# Patient Record
Sex: Male | Born: 1970
Health system: Southern US, Community
[De-identification: ages and names within clinical notes are randomized; demographics above are authoritative.]

## PROBLEM LIST (undated history)

## (undated) DIAGNOSIS — R739 Hyperglycemia, unspecified: Secondary | ICD-10-CM

## (undated) DIAGNOSIS — I2699 Other pulmonary embolism without acute cor pulmonale: Secondary | ICD-10-CM

## (undated) DIAGNOSIS — D573 Sickle-cell trait: Secondary | ICD-10-CM

## (undated) DIAGNOSIS — J189 Pneumonia, unspecified organism: Secondary | ICD-10-CM

## (undated) HISTORY — DX: Sickle-cell trait: D57.3

## (undated) HISTORY — DX: Other pulmonary embolism without acute cor pulmonale: I26.99

## (undated) HISTORY — DX: Pneumonia, unspecified organism: J18.9

## (undated) HISTORY — DX: Hyperglycemia, unspecified: R73.9

---

## 2008-03-06 DIAGNOSIS — J189 Pneumonia, unspecified organism: Secondary | ICD-10-CM

## 2008-03-06 HISTORY — DX: Pneumonia, unspecified organism: J18.9

## 2008-09-03 ENCOUNTER — Emergency Department (HOSPITAL_BASED_OUTPATIENT_CLINIC_OR_DEPARTMENT_OTHER): Admission: EM | Admit: 2008-09-03 | Discharge: 2008-09-03 | Payer: Self-pay | Admitting: Emergency Medicine

## 2008-09-03 ENCOUNTER — Ambulatory Visit: Payer: Self-pay | Admitting: Interventional Radiology

## 2008-11-16 ENCOUNTER — Ambulatory Visit: Payer: Self-pay | Admitting: Diagnostic Radiology

## 2008-11-16 ENCOUNTER — Emergency Department (HOSPITAL_BASED_OUTPATIENT_CLINIC_OR_DEPARTMENT_OTHER): Admission: EM | Admit: 2008-11-16 | Discharge: 2008-11-16 | Payer: Self-pay | Admitting: Emergency Medicine

## 2008-11-16 ENCOUNTER — Inpatient Hospital Stay (HOSPITAL_COMMUNITY): Admission: EM | Admit: 2008-11-16 | Discharge: 2008-11-19 | Payer: Self-pay | Admitting: Emergency Medicine

## 2008-11-17 ENCOUNTER — Ambulatory Visit: Payer: Self-pay | Admitting: Vascular Surgery

## 2008-11-17 ENCOUNTER — Encounter (INDEPENDENT_AMBULATORY_CARE_PROVIDER_SITE_OTHER): Payer: Self-pay | Admitting: Internal Medicine

## 2008-11-24 DIAGNOSIS — J13 Pneumonia due to Streptococcus pneumoniae: Secondary | ICD-10-CM | POA: Insufficient documentation

## 2008-11-25 ENCOUNTER — Ambulatory Visit: Payer: Self-pay | Admitting: Internal Medicine

## 2008-11-25 DIAGNOSIS — J90 Pleural effusion, not elsewhere classified: Secondary | ICD-10-CM | POA: Insufficient documentation

## 2008-12-04 ENCOUNTER — Ambulatory Visit: Payer: Self-pay | Admitting: Internal Medicine

## 2008-12-14 ENCOUNTER — Ambulatory Visit: Payer: Self-pay | Admitting: Internal Medicine

## 2010-03-30 ENCOUNTER — Emergency Department (HOSPITAL_BASED_OUTPATIENT_CLINIC_OR_DEPARTMENT_OTHER)
Admission: EM | Admit: 2010-03-30 | Discharge: 2010-03-31 | Payer: Self-pay | Source: Home / Self Care | Admitting: Emergency Medicine

## 2010-04-06 HISTORY — PX: FINGER SURGERY: SHX640

## 2010-06-05 DIAGNOSIS — I2699 Other pulmonary embolism without acute cor pulmonale: Secondary | ICD-10-CM

## 2010-06-05 DIAGNOSIS — Z86711 Personal history of pulmonary embolism: Secondary | ICD-10-CM | POA: Insufficient documentation

## 2010-06-05 HISTORY — DX: Other pulmonary embolism without acute cor pulmonale: I26.99

## 2010-06-10 LAB — CULTURE, BLOOD (ROUTINE X 2)

## 2010-06-10 LAB — DIFFERENTIAL
Basophils Absolute: 0 10*3/uL (ref 0.0–0.1)
Basophils Relative: 4 % — ABNORMAL HIGH (ref 0–1)
Eosinophils Absolute: 0.6 10*3/uL (ref 0.0–0.7)
Eosinophils Relative: 4 % (ref 0–5)
Eosinophils Relative: 2 % (ref 0–5)
Lymphocytes Relative: 11 % — ABNORMAL LOW (ref 12–46)
Monocytes Absolute: 1.8 10*3/uL — ABNORMAL HIGH (ref 0.1–1.0)
Monocytes Relative: 13 % — ABNORMAL HIGH (ref 3–12)
Neutrophils Relative %: 76 % (ref 43–77)

## 2010-06-10 LAB — HIV-1 RNA QUANT-NO REFLEX-BLD: HIV 1 RNA Quant: <48 copies/mL (ref <48)

## 2010-06-10 LAB — URINALYSIS, ROUTINE W REFLEX MICROSCOPIC
Bilirubin Urine: NEGATIVE
Glucose, UA: NEGATIVE mg/dL
Hgb urine dipstick: NEGATIVE
Protein, ur: NEGATIVE mg/dL
Specific Gravity, Urine: 1.016 (ref 1.005–1.030)

## 2010-06-10 LAB — COMPREHENSIVE METABOLIC PANEL
Albumin: 3.4 g/dL — ABNORMAL LOW (ref 3.5–5.2)
BUN: 9 mg/dL (ref 6–23)
Creatinine, Ser: 1.17 mg/dL (ref 0.4–1.5)
Potassium: 4 mEq/L (ref 3.5–5.1)
Total Protein: 6.9 g/dL (ref 6.0–8.3)

## 2010-06-10 LAB — CBC
HCT: 42.6 % (ref 39.0–52.0)
HCT: 38.1 % — ABNORMAL LOW (ref 39.0–52.0)
HCT: 39.5 % (ref 39.0–52.0)
Hemoglobin: 14.6 g/dL (ref 13.0–17.0)
MCHC: 34.2 g/dL (ref 30.0–36.0)
MCV: 78.8 fL (ref 78.0–100.0)
MCV: 79.6 fL (ref 78.0–100.0)
Platelets: 243 10*3/uL (ref 150–400)
Platelets: 272 10*3/uL (ref 150–400)
RBC: 5.41 MIL/uL (ref 4.22–5.81)
RDW: 14.5 % (ref 11.5–15.5)
RDW: 15 % (ref 11.5–15.5)
RDW: 15.3 % (ref 11.5–15.5)

## 2010-06-10 LAB — BASIC METABOLIC PANEL
BUN: 8 mg/dL (ref 6–23)
CO2: 27 mEq/L (ref 19–32)
Calcium: 8.5 mg/dL (ref 8.4–10.5)
Chloride: 103 mEq/L (ref 96–112)
Creatinine, Ser: 1.12 mg/dL (ref 0.4–1.5)
GFR calc Af Amer: >60 mL/min (ref >60)
GFR calc non Af Amer: >60 mL/min (ref >60)
Glucose, Bld: 140 mg/dL — ABNORMAL HIGH (ref 70–99)
Glucose, Bld: 109 mg/dL — ABNORMAL HIGH (ref 70–99)
Potassium: 3.8 mEq/L (ref 3.5–5.1)
Sodium: 140 mEq/L (ref 135–145)

## 2010-06-10 LAB — EXPECTORATED SPUTUM ASSESSMENT W GRAM STAIN, RFLX TO RESP C

## 2010-06-10 LAB — PHOSPHORUS: Phosphorus: 3.8 mg/dL (ref 2.3–4.6)

## 2010-06-10 LAB — APTT: aPTT: 33 seconds (ref 24–37)

## 2010-06-10 LAB — TROPONIN I: Troponin I: 0.02 ng/mL (ref 0.00–0.06)

## 2010-06-10 LAB — CK TOTAL AND CKMB (NOT AT ARMC)
CK, MB: 1 ng/mL (ref 0.3–4.0)
Total CK: 228 U/L (ref 7–232)

## 2010-06-10 LAB — MAGNESIUM: Magnesium: 2.1 mg/dL (ref 1.5–2.5)

## 2010-06-10 LAB — PROTIME-INR: Prothrombin Time: 14.9 seconds (ref 11.6–15.2)

## 2010-06-12 LAB — DIFFERENTIAL
Basophils Absolute: 0.2 10*3/uL — ABNORMAL HIGH (ref 0.0–0.1)
Lymphocytes Relative: 30 % (ref 12–46)
Monocytes Absolute: 1.1 10*3/uL — ABNORMAL HIGH (ref 0.1–1.0)
Monocytes Relative: 11 % (ref 3–12)
Neutro Abs: 5.5 10*3/uL (ref 1.7–7.7)
Neutrophils Relative %: 55 % (ref 43–77)

## 2010-06-12 LAB — CBC
Hemoglobin: 15 g/dL (ref 13.0–17.0)
RBC: 5.67 MIL/uL (ref 4.22–5.81)

## 2010-06-12 LAB — BASIC METABOLIC PANEL
Calcium: 8.9 mg/dL (ref 8.4–10.5)
Creatinine, Ser: 1.1 mg/dL (ref 0.4–1.5)
GFR calc Af Amer: >60 mL/min (ref >60)
GFR calc non Af Amer: >60 mL/min (ref >60)
Sodium: 142 mEq/L (ref 135–145)

## 2010-06-12 LAB — POCT CARDIAC MARKERS: Troponin i, poc: <0.05 ng/mL (ref 0.00–0.09)

## 2010-07-01 ENCOUNTER — Emergency Department (INDEPENDENT_AMBULATORY_CARE_PROVIDER_SITE_OTHER): Payer: 59

## 2010-07-01 ENCOUNTER — Emergency Department (HOSPITAL_BASED_OUTPATIENT_CLINIC_OR_DEPARTMENT_OTHER)
Admission: EM | Admit: 2010-07-01 | Discharge: 2010-07-02 | Disposition: A | Discharge status: Nursing Home (Includes ICF) | Payer: 59 | Source: Home / Self Care | Attending: Emergency Medicine | Admitting: Emergency Medicine

## 2010-07-01 DIAGNOSIS — I498 Other specified cardiac arrhythmias: Secondary | ICD-10-CM | POA: Diagnosis present

## 2010-07-01 DIAGNOSIS — R079 Chest pain, unspecified: Secondary | ICD-10-CM | POA: Insufficient documentation

## 2010-07-01 DIAGNOSIS — I2699 Other pulmonary embolism without acute cor pulmonale: Secondary | ICD-10-CM | POA: Insufficient documentation

## 2010-07-01 DIAGNOSIS — D72829 Elevated white blood cell count, unspecified: Secondary | ICD-10-CM | POA: Diagnosis present

## 2010-07-01 DIAGNOSIS — R0602 Shortness of breath: Secondary | ICD-10-CM | POA: Insufficient documentation

## 2010-07-01 DIAGNOSIS — E875 Hyperkalemia: Secondary | ICD-10-CM | POA: Diagnosis present

## 2010-07-01 DIAGNOSIS — K7689 Other specified diseases of liver: Secondary | ICD-10-CM | POA: Insufficient documentation

## 2010-07-02 ENCOUNTER — Inpatient Hospital Stay (HOSPITAL_COMMUNITY)
Admission: EM | Admit: 2010-07-02 | Discharge: 2010-07-05 | DRG: 176 | Disposition: A | Discharge status: Home / Self Care | Payer: 59 | Source: Other Acute Inpatient Hospital | Attending: Internal Medicine | Admitting: Internal Medicine

## 2010-07-02 ENCOUNTER — Emergency Department (INDEPENDENT_AMBULATORY_CARE_PROVIDER_SITE_OTHER): Payer: 59

## 2010-07-02 DIAGNOSIS — I2699 Other pulmonary embolism without acute cor pulmonale: Secondary | ICD-10-CM

## 2010-07-02 LAB — PROTIME-INR: INR: 1.02 (ref 0.00–1.49)

## 2010-07-02 LAB — COMPREHENSIVE METABOLIC PANEL
ALT: 37 U/L (ref 0–53)
Albumin: 3.9 g/dL (ref 3.5–5.2)
Alkaline Phosphatase: 49 U/L (ref 39–117)
Potassium: 3.9 mEq/L (ref 3.5–5.1)
Sodium: 136 mEq/L (ref 135–145)
Total Protein: 7.4 g/dL (ref 6.0–8.3)

## 2010-07-02 LAB — BASIC METABOLIC PANEL
BUN: 10 mg/dL (ref 6–23)
Chloride: 102 mEq/L (ref 96–112)
Glucose, Bld: 144 mg/dL — ABNORMAL HIGH (ref 70–99)
Potassium: 5.5 mEq/L — ABNORMAL HIGH (ref 3.5–5.1)

## 2010-07-02 LAB — CBC
HCT: 40.3 % (ref 39.0–52.0)
MCHC: 34.8 g/dL (ref 30.0–36.0)
MCV: 72 fL — ABNORMAL LOW (ref 78.0–100.0)
Platelets: 207 10*3/uL (ref 150–400)
RBC: 5.6 MIL/uL (ref 4.22–5.81)
RDW: 15.5 % (ref 11.5–15.5)
RDW: 17.4 % — ABNORMAL HIGH (ref 11.5–15.5)
WBC: 10.6 10*3/uL — ABNORMAL HIGH (ref 4.0–10.5)
WBC: 12.1 10*3/uL — ABNORMAL HIGH (ref 4.0–10.5)

## 2010-07-02 LAB — HOMOCYSTEINE: Homocysteine: 9 umol/L (ref 4.0–15.4)

## 2010-07-02 LAB — CARDIAC PANEL(CRET KIN+CKTOT+MB+TROPI)
CK, MB: 0.9 ng/mL (ref 0.3–4.0)
Relative Index: 0.5 (ref 0.0–2.5)
Total CK: 184 U/L (ref 7–232)
Total CK: 159 U/L (ref 7–232)

## 2010-07-02 LAB — APTT: aPTT: 33 seconds (ref 24–37)

## 2010-07-02 LAB — DIFFERENTIAL
Eosinophils Relative: 0 % (ref 0–5)
Lymphocytes Relative: 15 % (ref 12–46)
Lymphs Abs: 1.8 10*3/uL (ref 0.7–4.0)

## 2010-07-02 MED ORDER — IOHEXOL 350 MG/ML SOLN
80.0000 mL | Freq: Once | INTRAVENOUS | Status: AC | PRN
  Administered 2010-07-02: 80 mL via INTRAVENOUS

## 2010-07-03 ENCOUNTER — Inpatient Hospital Stay (HOSPITAL_COMMUNITY): Payer: 59

## 2010-07-03 LAB — COMPREHENSIVE METABOLIC PANEL
AST: 19 U/L (ref 0–37)
Albumin: 3.7 g/dL (ref 3.5–5.2)
BUN: 4 mg/dL — ABNORMAL LOW (ref 6–23)
Chloride: 101 mEq/L (ref 96–112)
Creatinine, Ser: 0.99 mg/dL (ref 0.4–1.5)
GFR calc Af Amer: >60 mL/min (ref >60)
Potassium: 3.6 mEq/L (ref 3.5–5.1)
Total Bilirubin: 0.7 mg/dL (ref 0.3–1.2)
Total Protein: 7.3 g/dL (ref 6.0–8.3)

## 2010-07-03 LAB — CBC
HCT: 38.3 % — ABNORMAL LOW (ref 39.0–52.0)
Hemoglobin: 13.2 g/dL (ref 13.0–17.0)
RDW: 15.7 % — ABNORMAL HIGH (ref 11.5–15.5)
WBC: 11.8 10*3/uL — ABNORMAL HIGH (ref 4.0–10.5)

## 2010-07-03 LAB — PROTIME-INR
INR: 1.16 (ref 0.00–1.49)
Prothrombin Time: 15 seconds (ref 11.6–15.2)

## 2010-07-04 LAB — PROTIME-INR: Prothrombin Time: 15.4 seconds — ABNORMAL HIGH (ref 11.6–15.2)

## 2010-07-04 LAB — LUPUS ANTICOAGULANT PANEL
DRVVT: 50.9 secs — ABNORMAL HIGH (ref 36.2–44.3)
Lupus Anticoagulant: NOT DETECTED
PTT Lupus Anticoagulant: 48.7 secs — ABNORMAL HIGH (ref 30.0–45.6)
PTTLA 4:1 Mix: 45.5 secs (ref 30.0–45.6)

## 2010-07-04 LAB — HEMOGLOBIN A1C
Hgb A1c MFr Bld: 5.8 % — ABNORMAL HIGH (ref <5.7)
Mean Plasma Glucose: 120 mg/dL — ABNORMAL HIGH (ref <117)

## 2010-07-04 LAB — CBC
HCT: 36.6 % — ABNORMAL LOW (ref 39.0–52.0)
Hemoglobin: 12.6 g/dL — ABNORMAL LOW (ref 13.0–17.0)
MCH: 25.6 pg — ABNORMAL LOW (ref 26.0–34.0)
MCHC: 34.4 g/dL (ref 30.0–36.0)
MCV: 74.4 fL — ABNORMAL LOW (ref 78.0–100.0)
RBC: 4.92 MIL/uL (ref 4.22–5.81)

## 2010-07-04 LAB — BASIC METABOLIC PANEL
BUN: 6 mg/dL (ref 6–23)
CO2: 27 mEq/L (ref 19–32)
Chloride: 102 mEq/L (ref 96–112)
GFR calc non Af Amer: >60 mL/min (ref >60)
Glucose, Bld: 117 mg/dL — ABNORMAL HIGH (ref 70–99)
Potassium: 3.9 mEq/L (ref 3.5–5.1)
Sodium: 138 mEq/L (ref 135–145)

## 2010-07-05 LAB — BETA-2-GLYCOPROTEIN I ABS, IGG/M/A
Beta-2 Glyco I IgG: 0 G Units (ref <20)
Beta-2-Glycoprotein I IgA: 4 A Units (ref <20)
Beta-2-Glycoprotein I IgM: 2 M Units (ref <20)

## 2010-07-05 LAB — CARDIOLIPIN ANTIBODIES, IGG, IGM, IGA
Anticardiolipin IgG: 6 GPL U/mL — ABNORMAL LOW (ref <23)
Anticardiolipin IgM: 2 MPL U/mL — ABNORMAL LOW (ref <11)

## 2010-07-05 LAB — CBC
MCH: 25.1 pg — ABNORMAL LOW (ref 26.0–34.0)
MCV: 73.1 fL — ABNORMAL LOW (ref 78.0–100.0)
Platelets: 329 10*3/uL (ref 150–400)
RDW: 15.3 % (ref 11.5–15.5)
WBC: 8 10*3/uL (ref 4.0–10.5)

## 2010-07-05 LAB — COMPREHENSIVE METABOLIC PANEL
ALT: 31 U/L (ref 0–53)
Albumin: 3.2 g/dL — ABNORMAL LOW (ref 3.5–5.2)
Alkaline Phosphatase: 50 U/L (ref 39–117)
Chloride: 104 mEq/L (ref 96–112)
Glucose, Bld: 115 mg/dL — ABNORMAL HIGH (ref 70–99)
Potassium: 3.8 mEq/L (ref 3.5–5.1)
Sodium: 138 mEq/L (ref 135–145)
Total Bilirubin: 0.5 mg/dL (ref 0.3–1.2)
Total Protein: 7.2 g/dL (ref 6.0–8.3)

## 2010-07-05 LAB — PROTIME-INR: Prothrombin Time: 17.3 seconds — ABNORMAL HIGH (ref 11.6–15.2)

## 2010-07-05 LAB — SEDIMENTATION RATE: Sed Rate: 71 mm/hr — ABNORMAL HIGH (ref 0–16)

## 2010-07-06 LAB — HEMOGLOBINOPATHY EVALUATION
Hemoglobin Other: 0 % (ref 0.0–0.0)
Hgb A2 Quant: 2.6 % (ref 2.2–3.2)
Hgb A: 59.3 % — ABNORMAL LOW (ref 96.8–97.8)

## 2010-07-07 ENCOUNTER — Encounter: Payer: Self-pay | Admitting: Internal Medicine

## 2010-07-08 ENCOUNTER — Encounter: Payer: Self-pay | Admitting: Internal Medicine

## 2010-07-08 ENCOUNTER — Ambulatory Visit (INDEPENDENT_AMBULATORY_CARE_PROVIDER_SITE_OTHER): Payer: 59 | Admitting: Internal Medicine

## 2010-07-08 VITALS — BP 128/84 | HR 89 | Ht 68.75 in | Wt 203.4 lb

## 2010-07-08 DIAGNOSIS — R739 Hyperglycemia, unspecified: Secondary | ICD-10-CM | POA: Insufficient documentation

## 2010-07-08 DIAGNOSIS — R7309 Other abnormal glucose: Secondary | ICD-10-CM

## 2010-07-08 DIAGNOSIS — I2699 Other pulmonary embolism without acute cor pulmonale: Secondary | ICD-10-CM

## 2010-07-08 DIAGNOSIS — Z86718 Personal history of other venous thrombosis and embolism: Secondary | ICD-10-CM

## 2010-07-08 NOTE — Patient Instructions (Addendum)
Hold lovenox Coumadin 5mg  a day as you are doing  Coumadin check  Monday (3 days) ER if severe chest pain, palpitations or shortness of breath Diet! Finish your antibiotic

## 2010-07-08 NOTE — Progress Notes (Signed)
  Subjective:    Patient ID: Dan Hernandez, male    DOB: 31-Aug-1970, 40 y.o.   MRN: [id number]  HPI New patient Presented to the emergency room 07-02-10 with right-sided chest pain, shortness of breath and tachycardia. D-dimer was positive, CT of the chest show a pulmonary emboli. Echocardiogram an ultrasound of the legs were normal, he had no DVT. He is set rate was 71 Blood cultures were negative, hemoglobin A1c was 5.8. The patient was discharged on Lovenox, Coumadin, Avelox (there was a questionable pneumonia, he has 4 tablets left). She was discharged home 07-05-10. As far as risk factors for PE, he takes very long car rides often, essentially he drives all throughout a [place]  Past Medical History  Diagnosis Date  . CAP (community acquired pneumonia) 2010    Prob, Wert. Onset 09/03/08, small parapneumonica effusion 11/2008, ESR 58 September 22,2010  . Pulmonary embolism 06-2010  . Hyperglycemia     A1C 5.12 June 2010    Past Surgical History  Procedure Date  . Finger surgery 04-2010    3 screws     Family History  Problem Relation Age of Onset  . Deep vein thrombosis Neg Hx     no PE-DVT  . Coronary artery disease      M  . Diabetes      uncle ?  Marland Kitchen Colon cancer Neg Hx   . Prostate cancer Neg Hx     History   Social History  . Marital Status: Single    Spouse Name: N/A    Number of Children: 3  . 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     Social History Main Topics  . Smoking status: Never Smoker   . Smokeless tobacco: Not on file  . Alcohol Use: Yes     occ  . Drug Use: No  . Sexually Active: Not on file   Other Topics Concern  . Not on file   Social History Narrative   Lives by himself..    Review of Systems Since he left the hospital denies any leg pain or leg swelling. He had right-sided chest pain, that is getting slightly better. Shortness of breath has not come back except for  last night when he had mild dyspnea. No nausea, vomiting, diarrhea No cough or hemoptysis. No blood in the stools.    Objective:   Physical Exam  Constitutional: He is oriented to person, place, and time. He appears well-developed and well-nourished. No distress.  HENT:  Head: Normocephalic and atraumatic.  Cardiovascular: Normal rate, regular rhythm and normal heart sounds.   No murmur heard. Pulmonary/Chest: Effort normal and breath sounds normal. No respiratory distress. He has no wheezes. He has no rales.  Abdominal: Soft. Bowel sounds are normal. He exhibits no distension. There is no tenderness. There is no rebound and no guarding.  Musculoskeletal: He exhibits no edema.  Neurological: He is alert and oriented to person, place, and time.  Psychiatric: He has a normal mood and affect. His behavior is normal. Judgment and thought content normal.          Assessment & Plan:  Today , I spent more than 30 min with the patient, >50% of the time counseling, and /or reviewing the chart and labs ordered by other providers

## 2010-07-08 NOTE — Assessment & Plan Note (Addendum)
Recent PE , risk factor is his job which include long car rides . INR therapeutic on (reportedly) 5mg  of coumadin qd Plan: Refer to hematology--- I'm concerned about the fact that he has a ongoing risk factor for PE (prolonged car rides), if hematology is concerned enough, pt may need to change jobs or stay on anticoagulation. Hold lovenox Coumadin 5mg  a day INR Monday (3 days) ER if severe chest pain, palpitations or shortness of breath

## 2010-07-08 NOTE — Assessment & Plan Note (Signed)
Diet discussed  Recheck in few montths

## 2010-07-10 LAB — CULTURE, BLOOD (ROUTINE X 2)
Culture: NO GROWTH
Culture: NO GROWTH

## 2010-07-11 ENCOUNTER — Ambulatory Visit (INDEPENDENT_AMBULATORY_CARE_PROVIDER_SITE_OTHER): Payer: 59 | Admitting: *Deleted

## 2010-07-11 DIAGNOSIS — Z7901 Long term (current) use of anticoagulants: Secondary | ICD-10-CM

## 2010-07-11 DIAGNOSIS — I2699 Other pulmonary embolism without acute cor pulmonale: Secondary | ICD-10-CM

## 2010-07-11 NOTE — Patient Instructions (Addendum)
No change, cont 5mg  qd Next in 3 days  [facility]  Pt aware Doristine Devoid

## 2010-07-12 ENCOUNTER — Ambulatory Visit: Payer: 59 | Admitting: Hematology & Oncology

## 2010-07-14 ENCOUNTER — Ambulatory Visit (INDEPENDENT_AMBULATORY_CARE_PROVIDER_SITE_OTHER): Payer: 59 | Admitting: *Deleted

## 2010-07-14 DIAGNOSIS — I82409 Acute embolism and thrombosis of unspecified deep veins of unspecified lower extremity: Secondary | ICD-10-CM

## 2010-07-14 DIAGNOSIS — Z7901 Long term (current) use of anticoagulants: Secondary | ICD-10-CM

## 2010-07-14 NOTE — Patient Instructions (Signed)
Per Dr. Drue Novel continue same dose recheck 10 days  Pt aware.

## 2010-07-21 ENCOUNTER — Other Ambulatory Visit: Payer: Self-pay

## 2010-07-21 MED ORDER — WARFARIN SODIUM 5 MG PO TABS: 5.0000 mg | ORAL_TABLET | Freq: Every day | ORAL | Status: DC

## 2010-07-22 ENCOUNTER — Ambulatory Visit (INDEPENDENT_AMBULATORY_CARE_PROVIDER_SITE_OTHER): Payer: 59 | Admitting: *Deleted

## 2010-07-22 DIAGNOSIS — Z7901 Long term (current) use of anticoagulants: Secondary | ICD-10-CM

## 2010-07-22 DIAGNOSIS — I2699 Other pulmonary embolism without acute cor pulmonale: Secondary | ICD-10-CM

## 2010-07-22 LAB — POCT INR: INR: 2

## 2010-07-22 NOTE — Patient Instructions (Addendum)
Pt informed no change 4 weeks ------------------------------------ please change the followup to  3 weeks Dan Hernandez    Left msg for pt to contact office and reschedule appt for 3 weeks.  Doristine Devoid

## 2010-07-28 NOTE — Discharge Summary (Signed)
  Dan Hernandez              ACCOUNT NO.:  [id number]  MEDICAL RECORD NO.:  [id number]           PATIENT TYPE:  I  LOCATION:  2041                         FACILITY:  [facility]  PHYSICIAN:  Edsel Shives I Kesleigh Morson, MD      DATE OF BIRTH:  1970-12-27  DATE OF ADMISSION:  07/02/2010 DATE OF DISCHARGE:  07/05/2010                              DISCHARGE SUMMARY   DISCHARGE DIAGNOSES: 1. Acute pulmonary embolism of the right lower lobe pulmonary artery. 2. Fever secondary to atelectasis involving the area. 3. Sickle cell trait. 4. Sinus tachycardia secondary to acute pulmonary embolism. 5. Microcytic hypochromic anemia.  DISCHARGE MEDICATIONS: 1. Lovenox 95 mg subcu twice daily for 5 days. 2. Coumadin 12.5 mg daily. 3. Claritin. 4. Multivitamin. 5. Hydrocodone APAP 5/325 one tablet q.6 hour as needed.  FOLLOWUP:  The patient will follow up with his MD to check his Coumadin level.  PROCEDURE: 1. CT angio, acute pulmonary embolus to the middle segment of the     right lower lobe pulmonary artery.  No pulmonary emboli elsewhere,     atelectasis involving in the lower lobe, right greater than left     and the right middle lobe.  Tiny right pleural effusion, diffuse     steatosis involved the visual liver. 2. Chest x-ray, but the airspace opacity favoring atelectasis although     pulmonary hemorrage cannot be excluded given the known pulmonary     embolus.  HISTORY OF PRESENT ILLNESS:  This is a 40 year old who presented with shortness of breath and chest pain.  EKG did show sinus tachycardia as 125 with no ST segment changes, found to have elevated D-dimer.  CT angiogram was positive for pulmonary embolism.  The patient admitted to telemetry, venous Doppler was negative.  Hypercoagulable panel was negative for protein S deficiency, and no evidence of cardiolipin or lupus.  The patient switched to Lovenox and Coumadin and the patient teaching was addressed during hospital stay.  During  hospital stay, the patient spiked fever, for that blood culture were negative.  Chest x-ray showed just atelectasis.  The patient's symptoms improved significantly with flutter wall.  Currently, we felt the patient is stable for discharge, need to follow up with his MD for further adjustment of his Coumadin.  Coumadin as we mentioned teaching done during hospital stay.  The patient had hemoglobin S trait and confirmed by hemoglobin electrophoresis.     Dan Vergara Bosie Helper, MD     HIE/MEDQ  D:  07/27/2010  T:  07/28/2010  Job:  [id number]  Electronically Signed by Ebony Cargo MD on 07/28/2010 09:43:28 AM

## 2010-08-03 ENCOUNTER — Ambulatory Visit (HOSPITAL_BASED_OUTPATIENT_CLINIC_OR_DEPARTMENT_OTHER): Payer: 59 | Admitting: Hematology & Oncology

## 2010-08-03 ENCOUNTER — Other Ambulatory Visit: Payer: Self-pay | Admitting: Hematology & Oncology

## 2010-08-03 DIAGNOSIS — I749 Embolism and thrombosis of unspecified artery: Secondary | ICD-10-CM

## 2010-08-03 DIAGNOSIS — D573 Sickle-cell trait: Secondary | ICD-10-CM

## 2010-08-03 DIAGNOSIS — Z7901 Long term (current) use of anticoagulants: Secondary | ICD-10-CM

## 2010-08-03 DIAGNOSIS — K7689 Other specified diseases of liver: Secondary | ICD-10-CM

## 2010-08-03 DIAGNOSIS — I2699 Other pulmonary embolism without acute cor pulmonale: Secondary | ICD-10-CM

## 2010-08-03 LAB — CBC WITH DIFFERENTIAL (CANCER CENTER ONLY)
BASO#: 0 10*3/uL (ref 0.0–0.2)
Eosinophils Absolute: 0.2 10*3/uL (ref 0.0–0.5)
HGB: 14 g/dL (ref 13.0–17.1)
LYMPH#: 2.6 10*3/uL (ref 0.9–3.3)
MCH: 25.3 pg — ABNORMAL LOW (ref 28.0–33.4)
MONO#: 0.5 10*3/uL (ref 0.1–0.9)
MONO%: 9.7 % (ref 0.0–13.0)
NEUT#: 2.2 10*3/uL (ref 1.5–6.5)
RBC: 5.53 10*6/uL (ref 4.20–5.70)
WBC: 5.6 10*3/uL (ref 4.0–10.0)

## 2010-08-03 LAB — PROTIME-INR (CHCC SATELLITE): INR: 2.1 (ref 2.0–3.5)

## 2010-08-05 LAB — D-DIMER, QUANTITATIVE: D-Dimer, Quant: 0.38 ug{FEU}/mL (ref 0.00–0.48)

## 2010-08-09 ENCOUNTER — Telehealth: Payer: Self-pay | Admitting: Internal Medicine

## 2010-08-09 NOTE — Telephone Encounter (Signed)
Note from oncology reviewed Has a idiopathi pulmonary emboli although he has sickle cell trait which imposse some risk. Hypercoagulable workup negative they recommend Coumadin for 2 years, initial INR for 2 months should be around 3. Please call patient, ask him to get an INR this week,

## 2010-08-10 NOTE — Telephone Encounter (Signed)
Pt has appt 08/12/10.

## 2010-08-12 ENCOUNTER — Ambulatory Visit (INDEPENDENT_AMBULATORY_CARE_PROVIDER_SITE_OTHER): Payer: 59 | Admitting: *Deleted

## 2010-08-12 DIAGNOSIS — Z7901 Long term (current) use of anticoagulants: Secondary | ICD-10-CM

## 2010-08-12 DIAGNOSIS — I2699 Other pulmonary embolism without acute cor pulmonale: Secondary | ICD-10-CM

## 2010-08-12 LAB — POCT INR: INR: 3.6

## 2010-08-12 NOTE — Patient Instructions (Addendum)
Oncology recommendation, for the next month is better to have he's INR close to 3. Apparently his Coumadin dose was increased from 25 mg a week to 52.5 mg weekly. Today INR is 3.6. Plan:  Coumadin 5 m one tablet daily but take  1.5 tablets Monday Wednesday and Friday Recheck in 2 weeks   Spoke w/ pt aware of new instructions. Doristine Devoid

## 2010-08-13 NOTE — H&P (Signed)
Dan Hernandez, Dan Hernandez              ACCOUNT NO.:  [id number]  MEDICAL RECORD NO.:  [id number]           PATIENT TYPE:  I  LOCATION:  2041                         FACILITY:  [facility]  PHYSICIAN:  Massie Maroon, MD        DATE OF BIRTH:  Jul 27, 1970  DATE OF ADMISSION:  07/02/2010 DATE OF DISCHARGE:                             HISTORY & PHYSICAL   CHIEF COMPLAINT:  I am short of breath.  HISTORY OF PRESENT ILLNESS:  A 40 year old male with complaints of shortness of breath starting Thursday.  The patient also notes some right-sided chest pain with inspiration.  The patient denies any fever, chills, cough, palpitations, nausea, vomiting, diarrhea, bright red blood per rectum, or black stool.  The patient was noted to be tachycardic in the ED.  EKG shows sinus tachycardia at 125, normal axis, no ST-T segment changes consistent with acute ischemia.  Cardiac markers were negative.  D-dimer was elevated and CT angio of chest showed pulmonary embolus.  The patient is sent over for evaluation of pulmonary embolus.  PAST MEDICAL HISTORY:  None.  PAST SURGICAL HISTORY:  Three screws in the right hand.  SOCIAL HISTORY:  The patient does not smoke, but he occasionally drinks. 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  FAMILY HISTORY:  There is no family history of blood clots.  ALLERGIES:  No known drug allergies.  MEDICATIONS:  None.  REVIEW OF SYSTEMS:  Negative for all 10-organ systems except for pertinent positives as stated above.  PHYSICAL EXAMINATION:  VITAL SIGNS:  Temperature 99.4, pulse 122, blood pressure 126/80, and pulse oximetry 96% on room air. HEENT:  Anicteric. NECK:  No JVD.  No bruit. HEART:  Tachycardic, S1-S2.  No murmurs, gallops, or rubs. LUNGS:  Clear to auscultation bilaterally. ABDOMEN:  Soft, obese, nontender, and nondistended.  Positive bowel sounds. EXTREMITIES:  No cyanosis, clubbing, or edema. SKIN:  No rashes. LYMPH NODES:  No  adenopathy. NEUROLOGIC:  Nonfocal.  LABORATORY DATA:  D-dimer 1.41.  CT angio of chest; acute pulmonary embolus to the medial segmental branch of the right lower pulmonary artery, no pulmonary emboli elsewhere, atelectasis in the lower lobes, right greater than left and right middle lobe, tiny right pleural effusion, diffuse steatosis in the visualized liver.  WBC 12.1, hemoglobin 14.3, platelet count is 350. Sodium 140, potassium 5.5 (high) BUN 10, and creatinine 1.1.  ASSESSMENT AND PLAN: 1. Pulmonary embolus:  The patient will be placed on Lovenox 1 mg/kg     subcu b.i.d.  The patient will be started on Coumadin, pharmacy to     dose.  Check lower extremity Dopplers as well as hypercoagulable     panel. 2. Tachycardia:  Check a TSH.  Check cardiac 2-D echo.  The patient     will have cardiac markers q.6 h x3 sets. 3. Hyperkalemia:  Kayexalate 30 g p.o. x1. 4. Deep vein thrombosis prophylaxis:  Lovenox/Coumadin.     Massie Maroon, MD     JYK/MEDQ  D:  07/02/2010  T:  07/02/2010  Job:  [id number]  Electronically Signed by Pearson Grippe MD on 08/13/2010  09:01:40 AM

## 2010-08-17 ENCOUNTER — Other Ambulatory Visit: Payer: Self-pay | Admitting: Internal Medicine

## 2010-08-19 ENCOUNTER — Ambulatory Visit: Payer: 59

## 2010-08-26 ENCOUNTER — Ambulatory Visit (INDEPENDENT_AMBULATORY_CARE_PROVIDER_SITE_OTHER): Payer: 59 | Admitting: *Deleted

## 2010-08-26 DIAGNOSIS — Z7901 Long term (current) use of anticoagulants: Secondary | ICD-10-CM

## 2010-08-26 DIAGNOSIS — Z79899 Other long term (current) drug therapy: Secondary | ICD-10-CM

## 2010-08-26 LAB — POCT INR: INR: 2.3

## 2010-08-26 NOTE — Patient Instructions (Signed)
Pt instructed Per Dr. Drue Novel New plan: 7.5mg  daily except 5mg  on Monday & Thursday recheck in 1 week Chemira Yetta Barre

## 2010-09-01 ENCOUNTER — Ambulatory Visit (INDEPENDENT_AMBULATORY_CARE_PROVIDER_SITE_OTHER): Payer: 59 | Admitting: *Deleted

## 2010-09-01 DIAGNOSIS — I2699 Other pulmonary embolism without acute cor pulmonale: Secondary | ICD-10-CM

## 2010-09-01 DIAGNOSIS — Z7901 Long term (current) use of anticoagulants: Secondary | ICD-10-CM

## 2010-09-01 NOTE — Patient Instructions (Signed)
Return to office in 2 weeks for PT/INR check  Continue current dose: 1 tab daily except 1 1/2 on M,W,F

## 2010-09-05 ENCOUNTER — Ambulatory Visit: Payer: 59 | Admitting: Internal Medicine

## 2010-09-05 DIAGNOSIS — Z0289 Encounter for other administrative examinations: Secondary | ICD-10-CM

## 2010-09-05 NOTE — Progress Notes (Unsigned)
  Subjective:    Patient ID: Dan Hernandez, male    DOB: 1970-06-28, 40 y.o.   MRN: [id number]  HPI    Review of Systems     Objective:   Physical Exam        Assessment & Plan:

## 2010-09-16 ENCOUNTER — Ambulatory Visit: Payer: 59

## 2010-09-16 DIAGNOSIS — Z0289 Encounter for other administrative examinations: Secondary | ICD-10-CM

## 2010-09-18 ENCOUNTER — Telehealth: Payer: Self-pay | Admitting: Internal Medicine

## 2010-09-18 NOTE — Telephone Encounter (Signed)
Advise patient: Due inr  

## 2010-09-19 NOTE — Telephone Encounter (Signed)
Message left for patient to return my call.  

## 2010-09-20 NOTE — Telephone Encounter (Signed)
Message left for patient to return my call.  

## 2010-09-21 NOTE — Telephone Encounter (Signed)
Pt scheduled appt

## 2010-09-23 ENCOUNTER — Ambulatory Visit (INDEPENDENT_AMBULATORY_CARE_PROVIDER_SITE_OTHER): Payer: 59 | Admitting: Internal Medicine

## 2010-09-23 DIAGNOSIS — Z7901 Long term (current) use of anticoagulants: Secondary | ICD-10-CM

## 2010-09-23 DIAGNOSIS — Z86718 Personal history of other venous thrombosis and embolism: Secondary | ICD-10-CM

## 2010-09-23 DIAGNOSIS — I2699 Other pulmonary embolism without acute cor pulmonale: Secondary | ICD-10-CM

## 2010-09-23 NOTE — Progress Notes (Signed)
Pt notes that he lost his sheet from previous visit and missed dose on Sunday. Pt has also been taking med incorrectly. Pt notes that he has been taking 1 tab on Monday and Friday and 1.5 tabs all other days.  I discussed this with Dr. Drue Novel and he recommends:  1. No more missed doses 2. Return back to:  1 tab daily EXCEPT 1.5 tabs on M,W,F only 3. Recheck in 2 weeks

## 2010-09-23 NOTE — Patient Instructions (Addendum)
I discussed this with Dr. Drue Novel and he recommends:  1. No more missed doses 2. Return back to:  1 tab daily EXCEPT 1.5 tabs on M,W,F only 3. Recheck in 2 weeks  [facility]

## 2010-10-07 ENCOUNTER — Ambulatory Visit (INDEPENDENT_AMBULATORY_CARE_PROVIDER_SITE_OTHER): Payer: 59 | Admitting: *Deleted

## 2010-10-07 ENCOUNTER — Other Ambulatory Visit: Payer: Self-pay | Admitting: Internal Medicine

## 2010-10-07 DIAGNOSIS — I2699 Other pulmonary embolism without acute cor pulmonale: Secondary | ICD-10-CM

## 2010-10-07 DIAGNOSIS — Z7901 Long term (current) use of anticoagulants: Secondary | ICD-10-CM

## 2010-10-07 NOTE — Patient Instructions (Signed)
Continue same dose return in 4 weeks.

## 2010-11-04 ENCOUNTER — Ambulatory Visit: Payer: 59 | Admitting: *Deleted

## 2010-11-04 ENCOUNTER — Ambulatory Visit: Payer: 59

## 2010-11-04 DIAGNOSIS — I82409 Acute embolism and thrombosis of unspecified deep veins of unspecified lower extremity: Secondary | ICD-10-CM

## 2010-11-04 DIAGNOSIS — Z7901 Long term (current) use of anticoagulants: Secondary | ICD-10-CM

## 2010-11-04 NOTE — Patient Instructions (Addendum)
Pt instructed no change recheck 4 weeks.  ------------------ Current:  5mg  tab: 1  daily except 1 1/2 on M,W,F  (42.5 mg/week) INRs have been in the low side of therapeutic :  Plan--- increase to 1.5 tab daily, 1 tab M W F (45 mg/week) Recheck in 3 weeks  [facility]

## 2010-11-28 ENCOUNTER — Other Ambulatory Visit: Payer: Self-pay | Admitting: Internal Medicine

## 2010-12-02 ENCOUNTER — Ambulatory Visit (INDEPENDENT_AMBULATORY_CARE_PROVIDER_SITE_OTHER): Payer: 59 | Admitting: *Deleted

## 2010-12-02 DIAGNOSIS — Z7901 Long term (current) use of anticoagulants: Secondary | ICD-10-CM

## 2010-12-02 DIAGNOSIS — I2699 Other pulmonary embolism without acute cor pulmonale: Secondary | ICD-10-CM

## 2010-12-02 MED ORDER — WARFARIN SODIUM 5 MG PO TABS: 5.0000 mg | ORAL_TABLET | ORAL | Status: DC

## 2010-12-02 NOTE — Patient Instructions (Addendum)
Return to office in  2 weeks for PT/INR  New dosing: 1 1/2 tab daily except 1 on M,W,F ----------------------------------------- Dose increased , see above [facility]

## 2010-12-16 ENCOUNTER — Ambulatory Visit (INDEPENDENT_AMBULATORY_CARE_PROVIDER_SITE_OTHER): Payer: Self-pay | Admitting: *Deleted

## 2010-12-16 DIAGNOSIS — I82409 Acute embolism and thrombosis of unspecified deep veins of unspecified lower extremity: Secondary | ICD-10-CM

## 2010-12-16 DIAGNOSIS — Z7901 Long term (current) use of anticoagulants: Secondary | ICD-10-CM

## 2010-12-16 LAB — POCT INR: INR: 2.3

## 2010-12-16 NOTE — Patient Instructions (Addendum)
Return to office in 3 weeks for PT/INR  Continue current dose: 1 1/2 tab daily except 1 on M,W,F ------------------------------------- As above [facility]

## 2011-01-06 ENCOUNTER — Encounter: Payer: Self-pay | Admitting: *Deleted

## 2011-01-06 ENCOUNTER — Ambulatory Visit (INDEPENDENT_AMBULATORY_CARE_PROVIDER_SITE_OTHER): Payer: 59 | Admitting: *Deleted

## 2011-01-06 DIAGNOSIS — Z7901 Long term (current) use of anticoagulants: Secondary | ICD-10-CM

## 2011-01-06 DIAGNOSIS — I82409 Acute embolism and thrombosis of unspecified deep veins of unspecified lower extremity: Secondary | ICD-10-CM

## 2011-01-06 LAB — POCT INR: INR: 2.9

## 2011-01-06 NOTE — Patient Instructions (Addendum)
Return in 4 Weeks for INR check.  Continue current dosing 1 1/2 tab daily except 1 on M,W,F. ....................Marland Kitchen Agree Willow Ora

## 2011-01-19 ENCOUNTER — Telehealth: Payer: Self-pay | Admitting: Internal Medicine

## 2011-01-19 NOTE — Telephone Encounter (Signed)
Rx on file at [facility] that was sent in September. Pt never went to pick it up. Advised pt to call [facility] for refill

## 2011-01-19 NOTE — Telephone Encounter (Signed)
Patient wants re for coumadin called in to new pharmacy [facility]

## 2011-02-03 ENCOUNTER — Ambulatory Visit (INDEPENDENT_AMBULATORY_CARE_PROVIDER_SITE_OTHER): Payer: 59 | Admitting: *Deleted

## 2011-02-03 DIAGNOSIS — I82409 Acute embolism and thrombosis of unspecified deep veins of unspecified lower extremity: Secondary | ICD-10-CM

## 2011-02-03 DIAGNOSIS — Z7901 Long term (current) use of anticoagulants: Secondary | ICD-10-CM

## 2011-02-03 MED ORDER — WARFARIN SODIUM 5 MG PO TABS: 5.0000 mg | ORAL_TABLET | ORAL | Status: DC

## 2011-02-03 NOTE — Patient Instructions (Signed)
Return to office in 4 weeks  No change continue : 1 1/2 tab daily except 1 on M,W,F

## 2011-03-06 ENCOUNTER — Ambulatory Visit: Payer: 59

## 2011-09-04 ENCOUNTER — Other Ambulatory Visit: Payer: Self-pay | Admitting: Internal Medicine

## 2011-09-05 NOTE — Telephone Encounter (Signed)
Refill done.  

## 2011-09-07 ENCOUNTER — Other Ambulatory Visit: Payer: Self-pay | Admitting: Internal Medicine

## 2011-09-08 NOTE — Telephone Encounter (Signed)
Refill done.  

## 2011-11-18 ENCOUNTER — Other Ambulatory Visit: Payer: Self-pay | Admitting: Internal Medicine

## 2011-11-20 NOTE — Telephone Encounter (Signed)
Pt has not had an OV within the past year. OK to refill?

## 2011-11-20 NOTE — Telephone Encounter (Signed)
Left detailed msg on pt's vmail.  

## 2011-11-20 NOTE — Telephone Encounter (Signed)
Cannot RF, please let patient know I'm concern about him, he needs a visit ASAP, he was recommended to use of Coumadin after his pulmonary emboli x 2 years but I cannot refill the medication without a office visit.

## 2012-04-04 ENCOUNTER — Telehealth: Payer: Self-pay | Admitting: Internal Medicine

## 2012-04-04 NOTE — Telephone Encounter (Signed)
Please advise 

## 2012-04-04 NOTE — Telephone Encounter (Signed)
Left detailed msg on pt's vmail.  

## 2012-04-04 NOTE — Telephone Encounter (Signed)
Patients wife called stating that patient has been w/o coumedin for a couple of months and has not been taking warfarin. Patient is currently in [facility] right now and would like to know if Dr. Drue Novel thinks it would be safe for him to fly? Wife states that patient is hoping to leave [place] this morning and would like a call back as soon as possible.  [phone] Or  [phone]

## 2012-04-04 NOTE — Telephone Encounter (Signed)
Status post PE 06/2010, saw hematology, hypercoagulation workup was negative. They recommended Coumadin for 2 years. In answer to the  question, yes he is at risk of clots when he flies. He was supposed to stay on Coumadin until 06-2012.

## 2012-07-10 ENCOUNTER — Ambulatory Visit: Payer: 59

## 2012-07-10 ENCOUNTER — Telehealth: Payer: Self-pay | Admitting: General Practice

## 2012-07-10 NOTE — Telephone Encounter (Signed)
Pt was scheduled on nurse schedule today for a coumadin check. Per our records pt has not been seen in office since 07/2010 and coumadin last checked 01/2011. Called pt to verify if he is currently on coumadin no answer. Pt needs an OV.

## 2012-07-15 ENCOUNTER — Ambulatory Visit (INDEPENDENT_AMBULATORY_CARE_PROVIDER_SITE_OTHER): Payer: 59 | Admitting: Internal Medicine

## 2012-07-15 DIAGNOSIS — R5381 Other malaise: Secondary | ICD-10-CM

## 2012-07-15 DIAGNOSIS — Z0289 Encounter for other administrative examinations: Secondary | ICD-10-CM

## 2012-07-17 NOTE — Progress Notes (Signed)
  Subjective:    Patient ID: Dan Hernandez, male    DOB: 05-27-1970, 42 y.o.   MRN: [id number]  HPI    Review of Systems     Objective:   Physical Exam        Assessment & Plan:

## 2012-07-18 ENCOUNTER — Telehealth: Payer: Self-pay | Admitting: Internal Medicine

## 2012-07-18 NOTE — Telephone Encounter (Signed)
Send a letter advising to schedule a visit ASAP, he is supposed to be on coumadin (I can not RF w/o OV)

## 2012-07-19 ENCOUNTER — Encounter: Payer: Self-pay | Admitting: *Deleted

## 2012-07-19 NOTE — Telephone Encounter (Signed)
Letter mailed to pt.  

## 2012-08-02 ENCOUNTER — Encounter: Payer: Self-pay | Admitting: Internal Medicine

## 2012-08-02 ENCOUNTER — Ambulatory Visit (INDEPENDENT_AMBULATORY_CARE_PROVIDER_SITE_OTHER): Payer: BC Managed Care – PPO | Admitting: Internal Medicine

## 2012-08-02 VITALS — BP 124/80 | HR 64 | Temp 98.2°F | Ht 68.5 in | Wt 207.0 lb

## 2012-08-02 DIAGNOSIS — R7309 Other abnormal glucose: Secondary | ICD-10-CM

## 2012-08-02 DIAGNOSIS — R5381 Other malaise: Secondary | ICD-10-CM | POA: Insufficient documentation

## 2012-08-02 DIAGNOSIS — R739 Hyperglycemia, unspecified: Secondary | ICD-10-CM

## 2012-08-02 DIAGNOSIS — R5383 Other fatigue: Secondary | ICD-10-CM

## 2012-08-02 DIAGNOSIS — I2699 Other pulmonary embolism without acute cor pulmonale: Secondary | ICD-10-CM

## 2012-08-02 LAB — COMPREHENSIVE METABOLIC PANEL
ALT: 77 U/L — ABNORMAL HIGH (ref 0–53)
AST: 53 U/L — ABNORMAL HIGH (ref 0–37)
Albumin: 4.3 g/dL (ref 3.5–5.2)
CO2: 25 mEq/L (ref 19–32)
Calcium: 9 mg/dL (ref 8.4–10.5)
Chloride: 106 mEq/L (ref 96–112)
Creatinine, Ser: 0.9 mg/dL (ref 0.4–1.5)
GFR: 113.55 mL/min (ref >60.00)
Potassium: 3.5 mEq/L (ref 3.5–5.1)
Total Protein: 7.5 g/dL (ref 6.0–8.3)

## 2012-08-02 LAB — HEMOGLOBIN A1C: Hgb A1c MFr Bld: 5.4 % (ref 4.6–6.5)

## 2012-08-02 LAB — CBC WITH DIFFERENTIAL/PLATELET
Eosinophils Relative: 1.5 % (ref 0.0–5.0)
HCT: 42.9 % (ref 39.0–52.0)
Hemoglobin: 14.3 g/dL (ref 13.0–17.0)
Lymphocytes Relative: 52.8 % — ABNORMAL HIGH (ref 12.0–46.0)
Lymphs Abs: 2.7 10*3/uL (ref 0.7–4.0)
Monocytes Relative: 9.5 % (ref 3.0–12.0)
Neutro Abs: 1.8 10*3/uL (ref 1.4–7.7)
Platelets: 295 10*3/uL (ref 150.0–400.0)
WBC: 5 10*3/uL (ref 4.5–10.5)

## 2012-08-02 LAB — TSH: TSH: 0.4 u[IU]/mL (ref 0.35–5.50)

## 2012-08-02 NOTE — Assessment & Plan Note (Addendum)
Diagnosed with pulmonary emboli 06-2010, hypercoagulability studies were negative, he saw hematology and they recommended anticoagulation for 2 years. He started  Coumadin  4-12 , last time he was monitored here was ~ 01-2011, after that visit he took coumadin "few more months" thus probably didn't complete 1 year of therapy. At this point he feels well (other than fatigue), he is taking a baby aspirin. Plan: We will discuss with hematology Addendum, discussed with hematology, is appropriate to continue aspirin 162 mg daily for life. Will notify the patient.

## 2012-08-02 NOTE — Progress Notes (Signed)
  Subjective:    Patient ID: Dan Hernandez, male    DOB: 23-Aug-1970, 42 y.o.   MRN: [id number]  HPI last routine office visit 2012. History of idiopathic pulmonary emboli, chart is reviewed, see assessment and plan. Today he also complained of lack of energy,  this is going on for a while, he is concerned because his fianc reports that he snores significantly and he has episodes when he "stops breathing".  Past Medical History  Diagnosis Date  . CAP (community acquired pneumonia) 2010    Prob, Wert. Onset 09/03/08, small parapneumonica effusion 11/2008, ESR 58 September 22,2010  . Pulmonary embolism 06-2010    hypercoag w/u (-), saw hematology, Rx coumadin x 2 years  . Hyperglycemia     A1C 5.12 June 2010  . Sickle cell trait    Past Surgical History  Procedure Laterality Date  . Finger surgery  04-2010    3 screws    History   Social History  . Marital Status: Single    Spouse Name: N/A    Number of Children: 3  . 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     Social History Main Topics  . Smoking status: Never Smoker   . Smokeless tobacco: Not on file  . Alcohol Use: Yes     Comment: occ drink  . Drug Use: No  . Sexually Active: Not on file   Other Topics Concern  . Not on file   Social History Narrative   Lives by himself..    Review of Systems He continue to drive long car-trips sometimes (for work). Denies chest pain, shortness of breath or palpitations. No leg pain or calf swelling recently. Admits to feeling sleepy throughout the day sometimes. No nausea, vomiting, diarrhea or blood in the stools.     Objective:   Physical Exam BP 124/80  Pulse 64  Temp(Src) 98.2 F (36.8 C) (Oral)  Ht 5' 8.5" (1.74 m)  Wt 207 lb (93.895 kg)  BMI 31.01 kg/m2  SpO2 94%  General -- alert, well-developed, NAD   Neck --anatomically he has a short neck  Lungs -- normal respiratory effort, no intercostal retractions,  no accessory muscle use, and normal breath sounds.   Heart-- normal rate, regular rhythm, no murmur, and no gallop.   Abdomen--soft, non-tender, no distention, no masses, no HSM, no guarding, and no rigidity.   Extremities-- no pretibial edema bilaterally  Neurologic-- alert & oriented X3 and strength normal in all extremities. Psych-- Cognition and judgment appear intact. Alert and cooperative with normal attention span and concentration.  not anxious appearing and not depressed appearing.       Assessment & Plan:

## 2012-08-02 NOTE — Patient Instructions (Addendum)
Please come back in 4 months, fasting for a physical exam 

## 2012-08-02 NOTE — Assessment & Plan Note (Signed)
Reports problems with  Fatigue, some somnolence in the setting of snoring and apparently episodes of apnea. Will check labs, if nothing there explains his symptoms, will set up a sleep study (apnea Lynk)

## 2012-08-03 ENCOUNTER — Encounter: Payer: Self-pay | Admitting: Internal Medicine

## 2012-08-05 ENCOUNTER — Other Ambulatory Visit: Payer: Self-pay | Admitting: Internal Medicine

## 2012-08-05 DIAGNOSIS — R0683 Snoring: Secondary | ICD-10-CM

## 2012-08-05 DIAGNOSIS — R5381 Other malaise: Secondary | ICD-10-CM

## 2012-08-06 ENCOUNTER — Telehealth: Payer: Self-pay | Admitting: *Deleted

## 2012-08-06 NOTE — Telephone Encounter (Signed)
Message copied by Nada Maclachlan on Tue Aug 06, 2012  4:50 PM ------      Message from: Willow Ora E      Created: Mon Aug 05, 2012  8:50 AM       Advise patient:      I discussed his case with hematology, recommend to take aspirin 81 mg 2 tablets daily for life.      His liver tests are elevated, unclear why, taking Tylenol or drinking alcohol may cause the test to be abnormal. Recommend not Tylenol or alcohol in the next 2 weeks, arrange LFTs in 2 weeks please.      Other test normal , Nothing to explain his fatigue so I will order a apnea Lynk. (Sleep study) ------

## 2012-08-06 NOTE — Telephone Encounter (Signed)
Discussed with pt.  Lab orders entered.

## 2012-08-20 ENCOUNTER — Telehealth: Payer: Self-pay | Admitting: Internal Medicine

## 2012-08-20 DIAGNOSIS — R400 Somnolence: Secondary | ICD-10-CM

## 2012-08-20 DIAGNOSIS — R0683 Snoring: Secondary | ICD-10-CM

## 2012-08-20 NOTE — Telephone Encounter (Signed)
In reference to order for Apnea Link study, patient's insurance/bcbs has denied.  Please advise.

## 2012-08-20 NOTE — Telephone Encounter (Signed)
Please refer to pulmonary, DX somnolence, snoring, rule out sleep apnea

## 2012-08-21 NOTE — Telephone Encounter (Signed)
Referral entered  

## 2012-09-10 ENCOUNTER — Telehealth: Payer: Self-pay | Admitting: Internal Medicine

## 2012-09-10 NOTE — Telephone Encounter (Signed)
Due for labs, please arrange: LFTs-- dx elevated LFTs Get a rainbow in case he needs further labs

## 2012-09-11 NOTE — Telephone Encounter (Signed)
Discussed with patient, lab orders entered and appointment scheduled.

## 2012-09-20 ENCOUNTER — Other Ambulatory Visit: Payer: BC Managed Care – PPO

## 2012-09-23 ENCOUNTER — Other Ambulatory Visit: Payer: BC Managed Care – PPO

## 2012-09-23 ENCOUNTER — Institutional Professional Consult (permissible substitution): Payer: BC Managed Care – PPO | Admitting: Pulmonary Disease

## 2012-12-05 ENCOUNTER — Telehealth: Payer: Self-pay

## 2012-12-05 NOTE — Telephone Encounter (Signed)
LM for CB  HM UTD WE t-dap and flu Last EKG 07/2010

## 2012-12-06 ENCOUNTER — Encounter: Payer: BC Managed Care – PPO | Admitting: Internal Medicine

## 2012-12-06 NOTE — Telephone Encounter (Signed)
Patient unaware of appt but has confirmed will make it Meds reconciled, pharmacy and allergies verified

## 2012-12-06 NOTE — Telephone Encounter (Signed)
Had to reschedule Could not leave work

## 2012-12-09 ENCOUNTER — Ambulatory Visit (INDEPENDENT_AMBULATORY_CARE_PROVIDER_SITE_OTHER): Payer: BC Managed Care – PPO | Admitting: Internal Medicine

## 2012-12-09 ENCOUNTER — Encounter: Payer: Self-pay | Admitting: Internal Medicine

## 2012-12-09 VITALS — BP 123/84 | HR 67 | Temp 97.7°F | Ht 69.2 in | Wt 207.2 lb

## 2012-12-09 DIAGNOSIS — Z23 Encounter for immunization: Secondary | ICD-10-CM

## 2012-12-09 DIAGNOSIS — I2699 Other pulmonary embolism without acute cor pulmonale: Secondary | ICD-10-CM

## 2012-12-09 DIAGNOSIS — Z0000 Encounter for general adult medical examination without abnormal findings: Secondary | ICD-10-CM | POA: Insufficient documentation

## 2012-12-09 LAB — HEPATIC FUNCTION PANEL
Albumin: 4.4 g/dL (ref 3.5–5.2)
Alkaline Phosphatase: 39 U/L (ref 39–117)
Total Protein: 7.7 g/dL (ref 6.0–8.3)

## 2012-12-09 LAB — LIPID PANEL
Cholesterol: 198 mg/dL (ref 0–200)
HDL: 34.7 mg/dL — ABNORMAL LOW (ref >39.00)
Total CHOL/HDL Ratio: 6
VLDL: 45.6 mg/dL — ABNORMAL HIGH (ref 0.0–40.0)

## 2012-12-09 NOTE — Progress Notes (Signed)
  Subjective:    Patient ID: Dan Hernandez, male    DOB: [birth year], 42 y.o.   MRN: [id number]  HPI CPX  Past Medical History  Diagnosis Date  . CAP (community acquired pneumonia) 2010    Prob, Wert. Onset 09/03/08, small parapneumonica effusion 11/2008, ESR 58 September 22,2010  . Pulmonary embolism 06-2010    hypercoag w/u (-), saw hematology, Rx coumadin x 2 years  . Hyperglycemia     A1C 5.12 June 2010  . Sickle cell trait    Past Surgical History  Procedure Laterality Date  . Finger surgery  04-2010    3 screws    History   Social History  . Marital Status: Single    Spouse Name: N/A    Number of Children: 3  . 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     Social History Main Topics  . Smoking status: Never Smoker   . Smokeless tobacco: Never Used  . Alcohol Use: Yes     Comment: occ drink  . Drug Use: No  . Sexual Activity: Not on file   Other Topics Concern  . Not on file   Social History Narrative   Lives by himself    Family History  Problem Relation Age of Onset  . Deep vein thrombosis Other     B (PE), siter , daughter  . Coronary artery disease Other     not sure  . Diabetes      uncle ?  Marland Kitchen Colon cancer Neg Hx   . Prostate cancer Neg Hx       Review of Systems Diet-- regular, eats fish>red meats Exercise-- active at work, nothing routine No  CP, SOB, lower extremity edema,  DOE. Denies fatigue Denies  nausea, vomiting diarrhea Denies  blood in the stools No dysphagia or odynophagia (-) cough, sputum production No dysuria, gross hematuria, difficulty urinating   No anxiety, depression         Objective:   Physical Exam BP 123/84  Pulse 67  Temp(Src) 97.7 F (36.5 C)  Ht 5' 9.2" (1.758 m)  Wt 207 lb 3.2 oz (93.985 kg)  BMI 30.41 kg/m2  SpO2 97% General -- alert, well-developed, NAD.  Neck --no thyromegaly , normal carotid pulse Lungs -- normal respiratory effort, no  intercostal retractions, no accessory muscle use, and normal breath sounds.  Heart-- normal rate, regular rhythm, no murmur.  Abdomen-- Not distended, good bowel sounds,soft, non-tender.  Extremities-- no pretibial edema bilaterally  Neurologic--  alert & oriented X3. Speech normal, gait normal, strength normal in all extremities  Psych-- Cooperative with normal attention span and concentration. No anxious appearing , no depressed appearing.      Assessment & Plan:

## 2012-12-09 NOTE — Patient Instructions (Signed)
Testicular Problems and Self-Exam   Men can examine themselves easily and effectively with positive results. Monthly exams detect problems early and save lives. There are numerous causes of swelling in the testicle. Testicular cancer usually appears as a firm painless lump in the front part of the testicle. This may feel like a dull ache or heavy feeling located in the lower abdomen (belly), groin, or scrotum.   The risk is greater in men with undescended testicles and it is more common in young men. It is responsible for almost a fifth of cancers in males between ages 15 and 34. Other common causes of swellings, lumps, and testicular pain include injuries, inflammation (soreness) from infection, hydrocele, and torsion. These are a few of the reasons to do monthly self-examination of the testicles. The exam only takes minutes and could add years to your life. Get in the habit!   SELF-EXAMINATION OF THE TESTICLES   The testicles are easiest to examine after warm baths or showers and are more difficult to examine when you are cold. This is because the muscles attached to the testicles retract and pull them up higher or into the abdomen. While standing, roll one testicle between the thumb and forefinger. Feel for lumps, swelling, or discomfort. A normal testicle is egg shaped and feels firm. It is smooth and not tender. The spermatic cord can be felt as a firm spaghetti-like cord at the back of the testicle. It is also important to examine your groins. This is the crease between the front of your leg and your abdomen. Also, feel for enlarged lymph nodes (glands). Enlarged nodes are also a cause for you to see your caregiver for evaluation.   Self-examination of the testicles and groin areas on a regular basis will help you to know what your own testicles and groins feel like. This will help you pick up an abnormality (difference) at an earlier stage. Early discovery is the key to curing this cancer or treating other  conditions. Any lump, change, or swelling in the testicle calls for immediate evaluation by your caregiver. Cancer of the testicle does not result in impotence and it does not prevent normal intercourse or prevent having children. If your caregiver feels that medical treatment or chemotherapy could lead to infertility, sperm can be frozen for future use. It is necessary to see a caregiver as soon as possible after the discovery of a lump in a testicle.   Document Released: 05/29/2000 Document Revised: 05/15/2011 Document Reviewed: 02/22/2008   ExitCare® Patient Information ©2014 [employer].

## 2012-12-09 NOTE — Assessment & Plan Note (Addendum)
Td today Flu shot today Never had a cscope  Labs from 5-14 reviwed, will check a FLP   (had some gatorade today) LFTs were slt increased, denies daily ETOH, no Tylenol-- recheck LFT Diet-exercise-STE discussed  RTC 1 year

## 2012-12-09 NOTE — Assessment & Plan Note (Addendum)
See previous comments. Today he reports a + FH of clots, He remains asymptomatic. rec to cont w/ ASA and avoid situations that may increase risk of clots, early sx discussed

## 2012-12-11 ENCOUNTER — Encounter: Payer: Self-pay | Admitting: *Deleted

## 2013-03-10 ENCOUNTER — Ambulatory Visit: Payer: BC Managed Care – PPO | Admitting: Internal Medicine

## 2013-03-10 DIAGNOSIS — Z0289 Encounter for other administrative examinations: Secondary | ICD-10-CM

## 2013-07-01 ENCOUNTER — Ambulatory Visit: Payer: Self-pay | Admitting: Internal Medicine

## 2014-08-14 ENCOUNTER — Emergency Department (HOSPITAL_COMMUNITY): Payer: BLUE CROSS/BLUE SHIELD

## 2014-08-14 ENCOUNTER — Encounter (HOSPITAL_COMMUNITY): Payer: Self-pay | Admitting: Emergency Medicine

## 2014-08-14 ENCOUNTER — Emergency Department (HOSPITAL_COMMUNITY)
Admission: EM | Admit: 2014-08-14 | Discharge: 2014-08-14 | Disposition: A | Discharge status: Home / Self Care | Payer: BLUE CROSS/BLUE SHIELD | Attending: Emergency Medicine | Admitting: Emergency Medicine

## 2014-08-14 DIAGNOSIS — W108XXA Fall (on) (from) other stairs and steps, initial encounter: Secondary | ICD-10-CM | POA: Insufficient documentation

## 2014-08-14 DIAGNOSIS — Z8701 Personal history of pneumonia (recurrent): Secondary | ICD-10-CM | POA: Insufficient documentation

## 2014-08-14 DIAGNOSIS — M5442 Lumbago with sciatica, left side: Secondary | ICD-10-CM | POA: Diagnosis not present

## 2014-08-14 DIAGNOSIS — Y998 Other external cause status: Secondary | ICD-10-CM | POA: Diagnosis not present

## 2014-08-14 DIAGNOSIS — S4992XA Unspecified injury of left shoulder and upper arm, initial encounter: Secondary | ICD-10-CM | POA: Diagnosis not present

## 2014-08-14 DIAGNOSIS — S3992XA Unspecified injury of lower back, initial encounter: Secondary | ICD-10-CM | POA: Insufficient documentation

## 2014-08-14 DIAGNOSIS — Z862 Personal history of diseases of the blood and blood-forming organs and certain disorders involving the immune mechanism: Secondary | ICD-10-CM | POA: Insufficient documentation

## 2014-08-14 DIAGNOSIS — Z7982 Long term (current) use of aspirin: Secondary | ICD-10-CM | POA: Insufficient documentation

## 2014-08-14 DIAGNOSIS — Y9389 Activity, other specified: Secondary | ICD-10-CM | POA: Insufficient documentation

## 2014-08-14 DIAGNOSIS — Z86711 Personal history of pulmonary embolism: Secondary | ICD-10-CM | POA: Diagnosis not present

## 2014-08-14 DIAGNOSIS — Y9289 Other specified places as the place of occurrence of the external cause: Secondary | ICD-10-CM | POA: Diagnosis not present

## 2014-08-14 DIAGNOSIS — I10 Essential (primary) hypertension: Secondary | ICD-10-CM | POA: Diagnosis not present

## 2014-08-14 MED ORDER — IBUPROFEN 600 MG PO TABS: 600.0000 mg | ORAL_TABLET | Freq: Four times a day (QID) | ORAL | Status: DC | PRN

## 2014-08-14 MED ORDER — IBUPROFEN 800 MG PO TABS
800.0000 mg | ORAL_TABLET | Freq: Once | ORAL | Status: AC
  Administered 2014-08-14: 800 mg via ORAL
  Filled 2014-08-14: qty 1

## 2014-08-14 NOTE — Discharge Instructions (Signed)
Back Exercises °Back exercises help treat and prevent back injuries. The goal of back exercises is to increase the strength of your abdominal and back muscles and the flexibility of your back. These exercises should be started when you no longer have back pain. Back exercises include: °· Pelvic Tilt. Lie on your back with your knees bent. Tilt your pelvis until the lower part of your back is against the floor. Hold this position 5 to 10 sec and repeat 5 to 10 times. °· Knee to Chest. Pull first 1 knee up against your chest and hold for 20 to 30 seconds, repeat this with the other knee, and then both knees. This may be done with the other leg straight or bent, whichever feels better. °· Sit-Ups or Curl-Ups. Bend your knees 90 degrees. Start with tilting your pelvis, and do a partial, slow sit-up, lifting your trunk only 30 to 45 degrees off the floor. Take at least 2 to 3 seconds for each sit-up. Do not do sit-ups with your knees out straight. If partial sit-ups are difficult, simply do the above but with only tightening your abdominal muscles and holding it as directed. °· Hip-Lift. Lie on your back with your knees flexed 90 degrees. Push down with your feet and shoulders as you raise your hips a couple inches off the floor; hold for 10 seconds, repeat 5 to 10 times. °· Back arches. Lie on your stomach, propping yourself up on bent elbows. Slowly press on your hands, causing an arch in your low back. Repeat 3 to 5 times. Any initial stiffness and discomfort should lessen with repetition over time. °· Shoulder-Lifts. Lie face down with arms beside your body. Keep hips and torso pressed to floor as you slowly lift your head and shoulders off the floor. °Do not overdo your exercises, especially in the beginning. Exercises may cause you some mild back discomfort which lasts for a few minutes; however, if the pain is more severe, or lasts for more than 15 minutes, do not continue exercises until you see your caregiver.  Improvement with exercise therapy for back problems is slow.  °See your caregivers for assistance with developing a proper back exercise program. °Document Released: 03/30/2004 Document Revised: 05/15/2011 Document Reviewed: 12/22/2010 °ExitCare® Patient Information ©2015 [employer]. This information is not intended to replace advice given to you by your health care provider. Make sure you discuss any questions you have with your health care provider. ° °Back Pain, Adult °Back pain is very common. The pain often gets better over time. The cause of back pain is usually not dangerous. Most people can learn to manage their back pain on their own.  °HOME CARE  °· Stay active. Start with short walks on flat ground if you can. Try to walk farther each day. °· Do not sit, drive, or stand in one place for more than 30 minutes. Do not stay in bed. °· Do not avoid exercise or work. Activity can help your back heal faster. °· Be careful when you bend or lift an object. Bend at your knees, keep the object close to you, and do not twist. °· Sleep on a firm mattress. Lie on your side, and bend your knees. If you lie on your back, put a pillow under your knees. °· Only take medicines as told by your doctor. °· Put ice on the injured area. °¨ Put ice in a plastic bag. °¨ Place a towel between your skin and the bag. °¨ Leave the   ice on for 15-20 minutes, 03-04 times a day for the first 2 to 3 days. After that, you can switch between ice and heat packs.  Ask your doctor about back exercises or massage.  Avoid feeling anxious or stressed. Find good ways to deal with stress, such as exercise. GET HELP RIGHT AWAY IF:   Your pain does not go away with rest or medicine.  Your pain does not go away in 1 week.  You have new problems.  You do not feel well.  The pain spreads into your legs.  You cannot control when you poop (bowel movement) or pee (urinate).  Your arms or legs feel weak or lose feeling  (numbness).  You feel sick to your stomach (nauseous) or throw up (vomit).  You have belly (abdominal) pain.  You feel like you may pass out (faint). MAKE SURE YOU:   Understand these instructions.  Will watch your condition.  Will get help right away if you are not doing well or get worse. Document Released: 08/09/2007 Document Revised: 05/15/2011 Document Reviewed: 06/24/2013 [facility] Patient Information 2015 [place], [place]. This information is not intended to replace advice given to you by your health care provider. Make sure you discuss any questions you have with your health care provider.   Please use ibuprofen as needed for pain, ice, rest, back exercises. Please monitor for new or worsening signs or symptoms, return immediately if any present. If symptoms continue to persist please follow-up with primary care provider for reevaluation. Please inform your primary care provider. Elevated blood pressure today.
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
## 2014-08-14 NOTE — ED Notes (Signed)
Bed: WTR7 Expected date:  Expected time:  Means of arrival:  Comments: Back pain, in [employer] custody

## 2014-09-02 ENCOUNTER — Telehealth: Payer: Self-pay | Admitting: Internal Medicine

## 2014-09-02 ENCOUNTER — Ambulatory Visit: Payer: BLUE CROSS/BLUE SHIELD | Admitting: Internal Medicine

## 2014-09-02 NOTE — Telephone Encounter (Signed)
Pt not able to make appt 09/02/14 11:00am, pt called and stated that his wife called him to remind of todays appt - he said that she changed the appt and he wasn't aware and cannot get out of work - resched for 7/5 1:15pm per pt req KO [facility] no show.

## 2014-09-08 ENCOUNTER — Telehealth: Payer: Self-pay

## 2014-09-08 ENCOUNTER — Ambulatory Visit (INDEPENDENT_AMBULATORY_CARE_PROVIDER_SITE_OTHER): Payer: BLUE CROSS/BLUE SHIELD | Admitting: Internal Medicine

## 2014-09-08 ENCOUNTER — Ambulatory Visit: Payer: BLUE CROSS/BLUE SHIELD | Admitting: Internal Medicine

## 2014-09-08 ENCOUNTER — Encounter: Payer: Self-pay | Admitting: Internal Medicine

## 2014-09-08 VITALS — BP 128/84 | HR 88 | Temp 97.8°F | Ht 69.0 in | Wt 210.2 lb

## 2014-09-08 DIAGNOSIS — F411 Generalized anxiety disorder: Secondary | ICD-10-CM

## 2014-09-08 MED ORDER — ESCITALOPRAM OXALATE 10 MG PO TABS: 10.0000 mg | ORAL_TABLET | Freq: Every day | ORAL | Status: DC

## 2014-09-08 NOTE — Assessment & Plan Note (Addendum)
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 I'm afraid that his anxiety can increase to a point that he won't be able to actually work, consequently I recommend him to take care of his family for the next 10 days, see letter.  Additionally, I did recommend him to seek the help of a lawyer to deal with his situation at work. He is currently having problems sleeping. Plan: As above Lexapro Counseling recommended Today , I spent more than  25  min with the patient: >50% of the time counseling regards anxiety treatment options and coordinating his care

## 2014-09-08 NOTE — Telephone Encounter (Signed)
Letter printed and given to Pt at OV today (09/08/2014).

## 2014-09-08 NOTE — Progress Notes (Signed)
Pre visit review using our clinic review tool, if applicable. No additional management support is needed unless otherwise documented below in the visit note. 

## 2014-09-08 NOTE — Patient Instructions (Signed)
Start taking Lexapro 10 mg: The first 10 days, take only half tablet daily at night Then change to 1 tablet daily at night Watch for excessive somnolence  Call if you have side effects  Please consider see one of our counselors

## 2014-09-08 NOTE — Telephone Encounter (Signed)
-----   Message from Wanda PlumpJose E Paz, MD sent at 09/08/2014  2:00 PM EDT ----- To whom it may concern Mr. Dan Hernandez is a patient of mine, he is seen today witht significant amount of anxiety related to the health of his sister. In my medical opinion, he needs to go take care of his sister for the next 10 days otherwise his anxiety will continue increasing. His severe anxiety may get to the point that will limit his ability to perform his work.

## 2014-09-08 NOTE — Progress Notes (Signed)
   Subjective:    Patient ID: Dan Hernandez, male    DOB: January 28, 1971, 44 y.o.   MRN: [id number]  DOS:  09/08/2014 Type of visit - description : Acute visit Interval history: The patient reports his sister got a lung transplant approximately 5 weeks ago in [place], she is now going through a lung  rejection, patient is quite distressed about the situation, he is also fearful that he may end up like his sister. Also reports  stress at his job, states that he works long hours and they won't let him take off to see his sister which is making his distress even worse   Review of Systems  denies chest pain or difficulty breathing No lower extremity edema Occasional palpitations Denies any suicidal or violent thoughts. He reports difficulty sleeping.   Past Medical History  Diagnosis Date  . CAP (community acquired pneumonia) 2010    Prob, Wert. Onset 09/03/08, small parapneumonica effusion 11/2008, ESR 58 September 22,2010  . Pulmonary embolism 06-2010    hypercoag w/u (-), saw hematology, Rx coumadin x 2 years  . Hyperglycemia     A1C 5.12 June 2010  . Sickle cell trait     Past Surgical History  Procedure Laterality Date  . Finger surgery  04-2010    3 screws     History   Social History  . Marital Status: Single    Spouse Name: N/A  . Number of Children: 3  . 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     Social History Main Topics  . Smoking status: Never Smoker   . Smokeless tobacco: Never Used  . Alcohol Use: Yes     Comment: occ drink  . Drug Use: No  . Sexual Activity: Not on file   Other Topics Concern  . Not on file   Social History Narrative   Lives w/ fiancee         Medication List       This list is accurate as of: 09/08/14  5:26 PM.  Always use your most recent med list.               aspirin 81 MG tablet  Take 162 mg by mouth daily.     escitalopram 10 MG tablet  Commonly known as:  LEXAPRO    Take 1 tablet (10 mg total) by mouth daily.     ibuprofen 600 MG tablet  Commonly known as:  ADVIL,MOTRIN  Take 1 tablet (600 mg total) by mouth every 6 (six) hours as needed.           Objective:   Physical Exam BP 128/84 mmHg  Pulse 88  Temp(Src) 97.8 F (36.6 C) (Oral)  Ht $R'5\' 9"'Zp$  (1.753 m)  Wt 210 lb 4 oz (95.369 kg)  BMI 31.03 kg/m2  SpO2 98% General:   Well developed, well nourished . NAD.  Skin: Exposed areas without rash. Not pale. Not jaundice Neurologic:  alert & oriented X3.  Speech normal, gait appropriate for age and unassisted Strength symmetric and appropriate for age.  Psych: Cognition and judgment appear intact.  Cooperative with normal attention span and concentration.  Behavior appropriate. Mild-moderate anxious but no depressed appearing.        Assessment & Plan:

## 2014-09-13 ENCOUNTER — Emergency Department (HOSPITAL_BASED_OUTPATIENT_CLINIC_OR_DEPARTMENT_OTHER)
Admission: EM | Admit: 2014-09-13 | Discharge: 2014-09-13 | Disposition: A | Discharge status: Home / Self Care | Payer: BLUE CROSS/BLUE SHIELD | Attending: Emergency Medicine | Admitting: Emergency Medicine

## 2014-09-13 ENCOUNTER — Encounter (HOSPITAL_BASED_OUTPATIENT_CLINIC_OR_DEPARTMENT_OTHER): Payer: Self-pay | Admitting: Emergency Medicine

## 2014-09-13 DIAGNOSIS — R110 Nausea: Secondary | ICD-10-CM | POA: Diagnosis not present

## 2014-09-13 DIAGNOSIS — Z8701 Personal history of pneumonia (recurrent): Secondary | ICD-10-CM | POA: Diagnosis not present

## 2014-09-13 DIAGNOSIS — R51 Headache: Secondary | ICD-10-CM | POA: Diagnosis not present

## 2014-09-13 DIAGNOSIS — W57XXXA Bitten or stung by nonvenomous insect and other nonvenomous arthropods, initial encounter: Secondary | ICD-10-CM | POA: Diagnosis not present

## 2014-09-13 DIAGNOSIS — S40862A Insect bite (nonvenomous) of left upper arm, initial encounter: Secondary | ICD-10-CM | POA: Insufficient documentation

## 2014-09-13 DIAGNOSIS — Y999 Unspecified external cause status: Secondary | ICD-10-CM | POA: Diagnosis not present

## 2014-09-13 DIAGNOSIS — R61 Generalized hyperhidrosis: Secondary | ICD-10-CM | POA: Diagnosis not present

## 2014-09-13 DIAGNOSIS — R05 Cough: Secondary | ICD-10-CM | POA: Diagnosis not present

## 2014-09-13 DIAGNOSIS — Z86711 Personal history of pulmonary embolism: Secondary | ICD-10-CM | POA: Diagnosis not present

## 2014-09-13 DIAGNOSIS — Y939 Activity, unspecified: Secondary | ICD-10-CM | POA: Insufficient documentation

## 2014-09-13 DIAGNOSIS — J029 Acute pharyngitis, unspecified: Secondary | ICD-10-CM | POA: Diagnosis not present

## 2014-09-13 DIAGNOSIS — R059 Cough, unspecified: Secondary | ICD-10-CM

## 2014-09-13 DIAGNOSIS — Z7982 Long term (current) use of aspirin: Secondary | ICD-10-CM | POA: Diagnosis not present

## 2014-09-13 DIAGNOSIS — Y9289 Other specified places as the place of occurrence of the external cause: Secondary | ICD-10-CM | POA: Insufficient documentation

## 2014-09-13 DIAGNOSIS — Z862 Personal history of diseases of the blood and blood-forming organs and certain disorders involving the immune mechanism: Secondary | ICD-10-CM | POA: Insufficient documentation

## 2014-09-13 DIAGNOSIS — S20469A Insect bite (nonvenomous) of unspecified back wall of thorax, initial encounter: Secondary | ICD-10-CM | POA: Insufficient documentation

## 2014-09-13 MED ORDER — DOXYCYCLINE HYCLATE 100 MG PO CAPS: ORAL_CAPSULE | ORAL | Status: DC

## 2014-09-13 MED ORDER — ACETAMINOPHEN 325 MG PO TABS
650.0000 mg | ORAL_TABLET | Freq: Once | ORAL | Status: AC
  Administered 2014-09-13: 650 mg via ORAL
  Filled 2014-09-13: qty 2

## 2014-09-13 NOTE — ED Notes (Signed)
Pt in c/o tick bite x 2 days ago. Removed tick w/o complications but is now feeling nauseous, diaphoretic, chills, and low grade fever.

## 2014-09-13 NOTE — ED Notes (Signed)
Pt seen by EDP prior to RN assessment, care assumed at time of d/c, orders received to d/c, pt d/c'd by other RN. Pt not seen by this RN.

## 2014-09-13 NOTE — ED Provider Notes (Signed)
CSN: [id number]     Arrival date & time 09/13/14  2216 History  This chart was scribed for Dan Fraise, MD by Chester Holstein, ED Scribe. This patient was seen in room MH11/MH11 and the patient's care was started at 11:07 PM.    Chief Complaint  Patient presents with  . Insect Bite      Patient is a 44 y.o. male presenting with animal bite. The history is provided by the patient. No language interpreter was used.  Animal Bite Contact animal:  Insect Location:  Torso Torso injury location:  Back Time since incident:  3 days Pain details:    Quality:  Itching   Severity:  No pain   Timing:  Constant   Progression:  Unchanged Incident location:  Outside Relieved by:  None tried Worsened by:  Nothing tried Ineffective treatments:  None tried Associated symptoms: no fever and no rash    HPI Comments: Dan Hernandez is a 44 y.o. male who presents to the Emergency Department complaining of tick bite to back with onset 2 days ago.  He was able to remove th tick. Pt notes associated nausea, diaphoresis, chills, headache, sore throat, and cough with onset yesterday. Pt denies rash, abdominal pain, and vomiting.   Past Medical History  Diagnosis Date  . CAP (community acquired pneumonia) 2010    Prob, Wert. Onset 09/03/08, small parapneumonica effusion 11/2008, ESR 58 September 22,2010  . Pulmonary embolism 06-2010    hypercoag w/u (-), saw hematology, Rx coumadin x 2 years  . Hyperglycemia     A1C 5.12 June 2010  . Sickle cell trait    Past Surgical History  Procedure Laterality Date  . Finger surgery  04-2010    3 screws    Family History  Problem Relation Age of Onset  . Deep vein thrombosis Other     B (PE), siter , daughter  . Coronary artery disease Other     not sure  . Diabetes      uncle ?  Marland Kitchen Colon cancer Neg Hx   . Prostate cancer Neg Hx    History  Substance Use Topics  . Smoking status: Never Smoker   . Smokeless tobacco: Never Used  . Alcohol Use: Yes   Comment: occ drink    Review of Systems  Constitutional: Positive for chills and diaphoresis. Negative for fever.  HENT: Positive for sore throat.   Respiratory: Positive for cough.   Gastrointestinal: Positive for nausea. Negative for vomiting and abdominal pain.  Skin: Negative for rash.  Neurological: Positive for headaches.  All other systems reviewed and are negative.     Allergies  Review of patient's allergies indicates no known allergies.  Home Medications   Prior to Admission medications   Medication Sig Start Date End Date Taking? Authorizing Provider  aspirin 81 MG tablet Take 162 mg by mouth daily.     Historical Provider, MD  escitalopram (LEXAPRO) 10 MG tablet Take 1 tablet (10 mg total) by mouth daily. 09/08/14   Colon Branch, MD  ibuprofen (ADVIL,MOTRIN) 600 MG tablet Take 1 tablet (600 mg total) by mouth every 6 (six) hours as needed. 08/14/14   Jeffrey Hedges, PA-C   BP 126/90 mmHg  Pulse 103  Temp(Src) 100 F (37.8 C) (Oral)  Resp 18  Ht _0  (1.727 m)  Wt 210 lb (95.255 kg)  BMI 31.94 kg/m2  SpO2 100% Physical Exam  Nursing note and vitals reviewed.  CONSTITUTIONAL: Well developed/well nourished HEAD: Normocephalic/atraumatic  EYES: EOMI/PERRL ENMT: Mucous membranes moist; uvula midline; no exudate or erythema NECK: supple no meningeal signs SPINE/BACK:entire spine nontender CV: S1/S2 noted, no murmurs/rubs/gallops noted LUNGS: Lungs are clear to auscultation bilaterally, no apparent distress ABDOMEN: soft, nontender, no rebound or guarding, bowel sounds noted throughout abdomen GU:no cva tenderness NEURO: Pt is awake/alert/appropriate, moves all extremitiesx4.  No facial droop.   EXTREMITIES: pulses normal/equal, full ROM SKIN: warm, color normal; small scab to left axilla no rash noted PSYCH: no abnormalities of mood noted, alert and oriented to situation   ED Course  Procedures  DIAGNOSTIC STUDIES: Oxygen Saturation is 100% on room air, normal  by my interpretation.    COORDINATION OF CARE: 11:11 PM Discussed treatment plan with patient at beside, the patient agrees with the plan and has no further questions at this time.  probable viral URI with fever/chills/cough/sore throat However does report recent tick bite Will give Rx for doxycycline but advised not to take unless symptoms don't improve within 48 hours.  Strong suspicion this will resolve spontaneously as likely viral URI  MDM   Final diagnoses:  Cough  Tick bite of left axillary region, initial encounter    Nursing notes including past medical history and social history reviewed and considered in documentation  I personally performed the services described in this documentation, which was scribed in my presence. The recorded information has been reviewed and is accurate.         Dan Fraise, MD 09/14/14 0111

## 2014-09-13 NOTE — ED Notes (Signed)
Pt informed that MD is not ordering a influenza test. Pt verbalized understanding.

## 2014-09-13 NOTE — ED Notes (Signed)
Pt wants to be tested for flu. MD made aware.

## 2014-10-05 ENCOUNTER — Other Ambulatory Visit: Payer: Self-pay

## 2014-10-06 ENCOUNTER — Ambulatory Visit: Payer: BLUE CROSS/BLUE SHIELD | Admitting: Internal Medicine

## 2015-11-09 ENCOUNTER — Telehealth: Payer: Self-pay | Admitting: Internal Medicine

## 2015-11-09 ENCOUNTER — Ambulatory Visit: Payer: BLUE CROSS/BLUE SHIELD | Admitting: Internal Medicine

## 2015-11-09 NOTE — Telephone Encounter (Signed)
Patient lvm cancelling 1pm appointment for today, [facility] 11/16/2015 1:00 PM charge or no charge.

## 2015-11-10 NOTE — Telephone Encounter (Signed)
No , is ok  

## 2015-11-16 ENCOUNTER — Ambulatory Visit: Payer: BLUE CROSS/BLUE SHIELD | Admitting: Internal Medicine

## 2015-11-16 ENCOUNTER — Telehealth: Payer: Self-pay | Admitting: Internal Medicine

## 2015-11-16 NOTE — Telephone Encounter (Signed)
Patient called @ 1:15 stating he was running late for his 1:00 appointment. Asked to reschedule. Charge or No Charge?

## 2015-11-16 NOTE — Telephone Encounter (Signed)
If he reschedule -- no charge

## 2015-11-16 NOTE — Telephone Encounter (Signed)
2nd no show/cancellation for same medical issue. Please advise.

## 2015-11-26 ENCOUNTER — Ambulatory Visit (HOSPITAL_BASED_OUTPATIENT_CLINIC_OR_DEPARTMENT_OTHER)
Admission: RE | Admit: 2015-11-26 | Discharge: 2015-11-26 | Disposition: A | Discharge status: Home / Self Care | Payer: BLUE CROSS/BLUE SHIELD | Source: Ambulatory Visit | Attending: Internal Medicine | Admitting: Internal Medicine

## 2015-11-26 ENCOUNTER — Ambulatory Visit (INDEPENDENT_AMBULATORY_CARE_PROVIDER_SITE_OTHER): Payer: BLUE CROSS/BLUE SHIELD | Admitting: Internal Medicine

## 2015-11-26 ENCOUNTER — Encounter: Payer: Self-pay | Admitting: Internal Medicine

## 2015-11-26 VITALS — BP 120/76 | HR 80 | Temp 97.9°F | Resp 14 | Ht 68.0 in | Wt 220.2 lb

## 2015-11-26 DIAGNOSIS — R0683 Snoring: Secondary | ICD-10-CM | POA: Diagnosis not present

## 2015-11-26 DIAGNOSIS — R079 Chest pain, unspecified: Secondary | ICD-10-CM | POA: Diagnosis not present

## 2015-11-26 DIAGNOSIS — R002 Palpitations: Secondary | ICD-10-CM | POA: Diagnosis not present

## 2015-11-26 NOTE — Progress Notes (Signed)
Pre visit review using our clinic review tool, if applicable. No additional management support is needed unless otherwise documented below in the visit note. 

## 2015-11-26 NOTE — Patient Instructions (Signed)
GO TO THE LAB : Get the blood work     GO TO THE FRONT DESK Schedule your next appointment for a  complete physical exam in 3 months, fasting  STOP BY THE FIRST FLOOR:  get the XR   If your symptoms resurface, call the office immediately, if symptoms severe go to the ER  Work on your diet and exercise

## 2015-11-26 NOTE — Progress Notes (Signed)
Subjective:    Patient ID: Dan Hernandez, male    DOB: 06-Feb-1971, 45 y.o.   MRN: [id number]  DOS:  11/26/2015 Type of visit - description : Acute visit Interval history: The patient felt poorly 3 weeks ago, symptoms lasted 24 hours, he is now back to normal. He has a hard time describing why he was feeling poorly: Fatigue, slightly sweaty, palpitations, have pain at the right upper back reminiscent to his PE few years ago. Again after 24 hours, he went back to normal.  He also reports a snoring with episodes of apnea.  Review of Systems  Denies fever chills No DOE, no SSCP, SOB or lower extremity edema No nausea, vomiting, diarrhea or blood in the stools No cough. No rash No anxiety or depression He remains active, works for [employer], gets in and out of the truck. No recent airplane trips.  Past Medical History:  Diagnosis Date  . CAP (community acquired pneumonia) 2010   Prob, Wert. Onset 09/03/08, small parapneumonica effusion 11/2008, ESR 58 September 22,2010  . Hyperglycemia    A1C 5.12 June 2010  . Pulmonary embolism ([place]) 06-2010   hypercoag w/u (-), saw hematology, Rx coumadin x 2 years  . Sickle cell trait [facility])     Past Surgical History:  Procedure Laterality Date  . FINGER SURGERY  04-2010   3 screws     Social History   Social History  . Marital status: Single    Spouse name: N/A  . Number of children: 3  . Years of education: N/A   Occupational History  . 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 Smoking status: Never Smoker  . Smokeless tobacco: Never Used  . Alcohol use Yes     Comment: occ drink  . Drug use: No  . Sexual activity: Not on file   Other Topics Concern  . Not on file   Social History Narrative   Lives w/ fiancee         Medication List       Accurate as of 11/26/15 10:44 AM. Always use your most recent med list.          aspirin 81 MG tablet Take 162 mg  by mouth daily.   escitalopram 10 MG tablet Commonly known as:  LEXAPRO Take 1 tablet (10 mg total) by mouth daily.   ibuprofen 600 MG tablet Commonly known as:  ADVIL,MOTRIN Take 1 tablet (600 mg total) by mouth every 6 (six) hours as needed.          Objective:   Physical Exam BP 120/76 (BP Location: Left Arm, Patient Position: Sitting, Cuff Size: Normal)   Pulse 80   Temp 97.9 F (36.6 C) (Oral)   Resp 14   Ht '5\' 8"'  (1.727 m)   Wt 220 lb 4 oz (99.9 kg)   SpO2 98%   BMI 33.49 kg/m  General:   Well developed, well nourished . NAD.  HEENT:  Normocephalic . Face symmetric, atraumatic Lungs:  CTA B Normal respiratory effort, no intercostal retractions, no accessory muscle use. Heart: RRR,  no murmur.  no pretibial edema bilaterally . Calves symmetric Abdomen:  Not distended, soft, non-tender. No rebound or rigidity.   Skin: Not pale. Not jaundice MSK: No TTP at the right upper back Neurologic:  alert & oriented X3.  Speech normal, gait appropriate for age and unassisted Psych--  Cognition and judgment appear intact.  Cooperative with normal attention span and concentration.  Behavior appropriate. No anxious or depressed appearing.     Assessment & Plan:   Assessment Hyperglycemia --- A1c 5.8  (2012) Pulmonary emboli 2012, hyper coag w/u (-), hematology Rx Coumadin for 2 years Pneumonia 2010, small Pneumonic effusion. Sickle cell trait  PLAN: Episode of palpitations, right sided thorax pain, some diaphoresis: Onset 3 weeks ago, lasted 24 hours, now essentially asx. EKG today shows sinus rhythm without acute changes. Giving history of PE he is at higher than average  risk for repeated events. Plan: Ddimer (stat), chest x-ray, CBC, BMP. Otherwise observation. History of hyperglycemia: Check A1c Snoring: Epworth scale 7, borderline, currently not feeling fatigue, recommend weight loss Addendum- pt did not get labs done , will call him RTC 3 months, CPX

## 2015-11-27 DIAGNOSIS — Z09 Encounter for follow-up examination after completed treatment for conditions other than malignant neoplasm: Secondary | ICD-10-CM | POA: Insufficient documentation

## 2015-11-27 NOTE — Assessment & Plan Note (Signed)
Episode of palpitations, right sided thorax pain, some diaphoresis: Onset 3 weeks ago, lasted 24 hours, now essentially asx. EKG today shows sinus rhythm without acute changes. Giving history of PE he is at higher than average  risk for repeated events. Plan: Ddimer (stat), chest x-ray, CBC, BMP. Otherwise observation. History of hyperglycemia: Check A1c Snoring: Epworth scale 7, borderline, currently not feeling fatigue, recommend weight loss Addendum- pt did not get labs done , will call him RTC 3 months, CPX

## 2016-02-07 ENCOUNTER — Encounter: Payer: Self-pay | Admitting: Internal Medicine

## 2016-02-07 ENCOUNTER — Ambulatory Visit (INDEPENDENT_AMBULATORY_CARE_PROVIDER_SITE_OTHER): Payer: BLUE CROSS/BLUE SHIELD | Admitting: Internal Medicine

## 2016-02-07 VITALS — BP 126/78 | HR 100 | Temp 97.8°F | Resp 14 | Ht 68.0 in | Wt 222.2 lb

## 2016-02-07 DIAGNOSIS — R739 Hyperglycemia, unspecified: Secondary | ICD-10-CM | POA: Diagnosis not present

## 2016-02-07 DIAGNOSIS — Z23 Encounter for immunization: Secondary | ICD-10-CM | POA: Diagnosis not present

## 2016-02-07 DIAGNOSIS — Z0000 Encounter for general adult medical examination without abnormal findings: Secondary | ICD-10-CM | POA: Diagnosis not present

## 2016-02-07 DIAGNOSIS — Z125 Encounter for screening for malignant neoplasm of prostate: Secondary | ICD-10-CM | POA: Diagnosis not present

## 2016-02-07 LAB — COMPREHENSIVE METABOLIC PANEL
ALBUMIN: 4.4 g/dL (ref 3.5–5.2)
ALT: 45 U/L (ref 0–53)
AST: 24 U/L (ref 0–37)
Alkaline Phosphatase: 45 U/L (ref 39–117)
BILIRUBIN TOTAL: 0.4 mg/dL (ref 0.2–1.2)
BUN: 10 mg/dL (ref 6–23)
CALCIUM: 9.7 mg/dL (ref 8.4–10.5)
CHLORIDE: 106 meq/L (ref 96–112)
CO2: 28 meq/L (ref 19–32)
CREATININE: 1 mg/dL (ref 0.40–1.50)
GFR: 103.99 mL/min (ref >60.00)
Glucose, Bld: 117 mg/dL — ABNORMAL HIGH (ref 70–99)
Potassium: 4.3 mEq/L (ref 3.5–5.1)
SODIUM: 141 meq/L (ref 135–145)
Total Protein: 7.3 g/dL (ref 6.0–8.3)

## 2016-02-07 LAB — LIPID PANEL
CHOL/HDL RATIO: 6
CHOLESTEROL: 185 mg/dL (ref 0–200)
HDL: 32.7 mg/dL — ABNORMAL LOW (ref >39.00)
LDL CALC: 114 mg/dL — AB (ref 0–99)
NonHDL: 151.94
TRIGLYCERIDES: 190 mg/dL — AB (ref 0.0–149.0)
VLDL: 38 mg/dL (ref 0.0–40.0)

## 2016-02-07 LAB — HEMOGLOBIN A1C: Hgb A1c MFr Bld: 5.8 % (ref 4.6–6.5)

## 2016-02-07 LAB — CBC WITH DIFFERENTIAL/PLATELET
BASOS ABS: 0 10*3/uL (ref 0.0–0.1)
Basophils Relative: 0.7 % (ref 0.0–3.0)
EOS ABS: 0.1 10*3/uL (ref 0.0–0.7)
Eosinophils Relative: 2.2 % (ref 0.0–5.0)
HEMATOCRIT: 43.7 % (ref 39.0–52.0)
HEMOGLOBIN: 14.6 g/dL (ref 13.0–17.0)
LYMPHS PCT: 48.4 % — AB (ref 12.0–46.0)
Lymphs Abs: 2.3 10*3/uL (ref 0.7–4.0)
MCHC: 33.5 g/dL (ref 30.0–36.0)
MCV: 74.1 fl — AB (ref 78.0–100.0)
MONOS PCT: 9.5 % (ref 3.0–12.0)
Monocytes Absolute: 0.5 10*3/uL (ref 0.1–1.0)
Neutro Abs: 1.9 10*3/uL (ref 1.4–7.7)
Neutrophils Relative %: 39.2 % — ABNORMAL LOW (ref 43.0–77.0)
Platelets: 340 10*3/uL (ref 150.0–400.0)
RBC: 5.9 Mil/uL — AB (ref 4.22–5.81)
RDW: 16.3 % — ABNORMAL HIGH (ref 11.5–15.5)
WBC: 4.8 10*3/uL (ref 4.0–10.5)

## 2016-02-07 LAB — PSA: PSA: 1.25 ng/mL (ref 0.10–4.00)

## 2016-02-07 NOTE — Assessment & Plan Note (Addendum)
Td 2014;  Flu shot today Never had a cscope  prostate  cancer screening: guidelines are changing, he is African-American thus we did a DRE today, normal. Will check a PSA Labs: CMP, FLP, CBC, A1c, PSA Diet-exercise-  discussed  RTC 1 year

## 2016-02-07 NOTE — Patient Instructions (Signed)
GO TO THE LAB : Get the blood work     GO TO THE FRONT DESK Schedule your next appointment for a  physical exam in one year. Fasting

## 2016-02-07 NOTE — Assessment & Plan Note (Signed)
Palpitations, right sided thorax pain: see last OV. Patient didn't pursue blood work, he is now asx Hyperglycemia: Check A1c Sleep apnea?  Sx are chronic, since at least 2014, in the past a home sleep study was Rx , not done to my knowledge. epworth scale 11-2015 and today average  (8 points today). Recommend weight loss and a dental appliance RTC 1 year

## 2016-02-07 NOTE — Progress Notes (Signed)
Pre visit review using our clinic review tool, if applicable. No additional management support is needed unless otherwise documented below in the visit note. 

## 2016-02-07 NOTE — Progress Notes (Signed)
Subjective:    Patient ID: Dan Hernandez, male    DOB: 1970/05/19, 45 y.o.   MRN: [id number]  DOS:  02/07/2016 Type of visit - description : CPX Interval history: Used to be Lexapro, feeling well, not taking it anymore.    Review of Systems Constitutional: No fever. No chills. No unexplained wt changes. No unusual sweats  HEENT: No dental problems, no ear discharge, no facial swelling, no voice changes. No eye discharge, no eye  redness , no  intolerance to light   Respiratory:  Fiance has noted that he snores, episodes of apnea? He feels well, no major problems with fatigue. No wheezing , no  difficulty breathing. No cough , no mucus production  Cardiovascular: No CP, no leg swelling , no  Palpitations  GI: no nausea, no vomiting, no diarrhea , no  abdominal pain.  No blood in the stools. No dysphagia, no odynophagia    Endocrine: No polyphagia, no polyuria , no polydipsia  GU: No dysuria, gross hematuria, difficulty urinating. No urinary urgency, no frequency.  Musculoskeletal: No joint swellings or unusual aches or pains  Skin: No change in the color of the skin, palor , no  Rash  Allergic, immunologic: No environmental allergies , no  food allergies  Neurological: No dizziness no  syncope. No headaches. No diplopia, no slurred, no slurred speech, no motor deficits, no facial  Numbness  Hematological: No enlarged lymph nodes, no easy bruising , no unusual bleedings  Psychiatry: No suicidal ideas, no hallucinations, no beavior problems, no confusion.  No unusual/severe anxiety, no depression   Past Medical History:  Diagnosis Date  . CAP (community acquired pneumonia) 2010   Prob, Wert. Onset 09/03/08, small parapneumonica effusion 11/2008, ESR 58 September 22,2010  . Hyperglycemia    A1C 5.12 June 2010  . Pulmonary embolism ([place]) 06-2010   hypercoag w/u (-), saw hematology, Rx coumadin x 2 years  . Sickle cell trait [facility])     Past Surgical History:  Procedure  Laterality Date  . FINGER SURGERY  04-2010   3 screws     Social History   Social History  . Marital status: Significant Other    Spouse name: N/A  . Number of children: 3  . Years of education: N/A   Occupational History  . 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 Smoking status: Never Smoker  . Smokeless tobacco: Never Used  . Alcohol use Yes     Comment: occ drink  . Drug use: No  . Sexual activity: Not on file   Other Topics Concern  . Not on file   Social History Narrative   Lives w/ fiancee      Family History  Problem Relation Age of Onset  . Deep vein thrombosis Other     B (PE), siter , daughter  . Coronary artery disease Other     not sure  . Diabetes      uncle ?  Marland Kitchen Colon cancer Neg Hx   . Prostate cancer Neg Hx        Medication List       Accurate as of 02/07/16  9:39 PM. Always use your most recent med list.          aspirin 81 MG tablet Take 162 mg by mouth daily.          Objective:   Physical Exam BP  126/78 (BP Location: Left Arm, Patient Position: Sitting, Cuff Size: Normal)   Pulse 100   Temp 97.8 F (36.6 C) (Oral)   Resp 14   Ht _0  (1.727 m)   Wt 222 lb 4 oz (100.8 kg)   SpO2 98%   BMI 33.79 kg/m   General:   Well developed, well nourished . NAD.  Neck: No  thyromegaly  HEENT:  Normocephalic . Face symmetric, atraumatic Lungs:  CTA B Normal respiratory effort, no intercostal retractions, no accessory muscle use. Heart: RRR,  no murmur.  No pretibial edema bilaterally  Abdomen:  Not distended, soft, non-tender. No rebound or rigidity. Rectal:  External abnormalities: none. Normal sphincter tone. No rectal masses or tenderness.  Stool brown  Prostate: Prostate gland firm and smooth, no enlargement, nodularity, tenderness, mass, asymmetry or induration.  Skin: Exposed areas without rash. Not pale. Not jaundice Neurologic:  alert & oriented X3.  Speech normal,  gait appropriate for age and unassisted Strength symmetric and appropriate for age.  Psych: Cognition and judgment appear intact.  Cooperative with normal attention span and concentration.  Behavior appropriate. No anxious or depressed appearing.    Assessment & Plan:   Assessment Hyperglycemia --- A1c 5.8  (2012) Pulmonary emboli 2012, hyper coag w/u (-), hematology Rx Coumadin for 2 years + FH clots  Pneumonia 2010, small Pneumonic effusion. Sickle cell trait Snoring-   PLAN: Palpitations, right sided thorax pain: see last OV. Patient didn't pursue blood work, he is now asx Hyperglycemia: Check A1c Sleep apnea?  Sx are chronic, since at least 2014, in the past a home sleep study was Rx , not done to my knowledge. epworth scale 11-2015 and today average  (8 points today). Recommend weight loss and a dental appliance RTC 1 year

## 2016-10-09 IMAGING — DX DG CHEST 2V
2 series · 2 of 2 positions shown · non-contrast
Comparison: CT chest 07/02/2010.  PA and lateral chest 07/01/2010.

CLINICAL DATA: The patient's wife reports that the patient stops
breathing while sleeping. Fatigue, palpitations and mild diaphoresis
3 weeks ago. History of prior pulmonary embolus.

EXAM:
CHEST  2 VIEW

[chest pa]
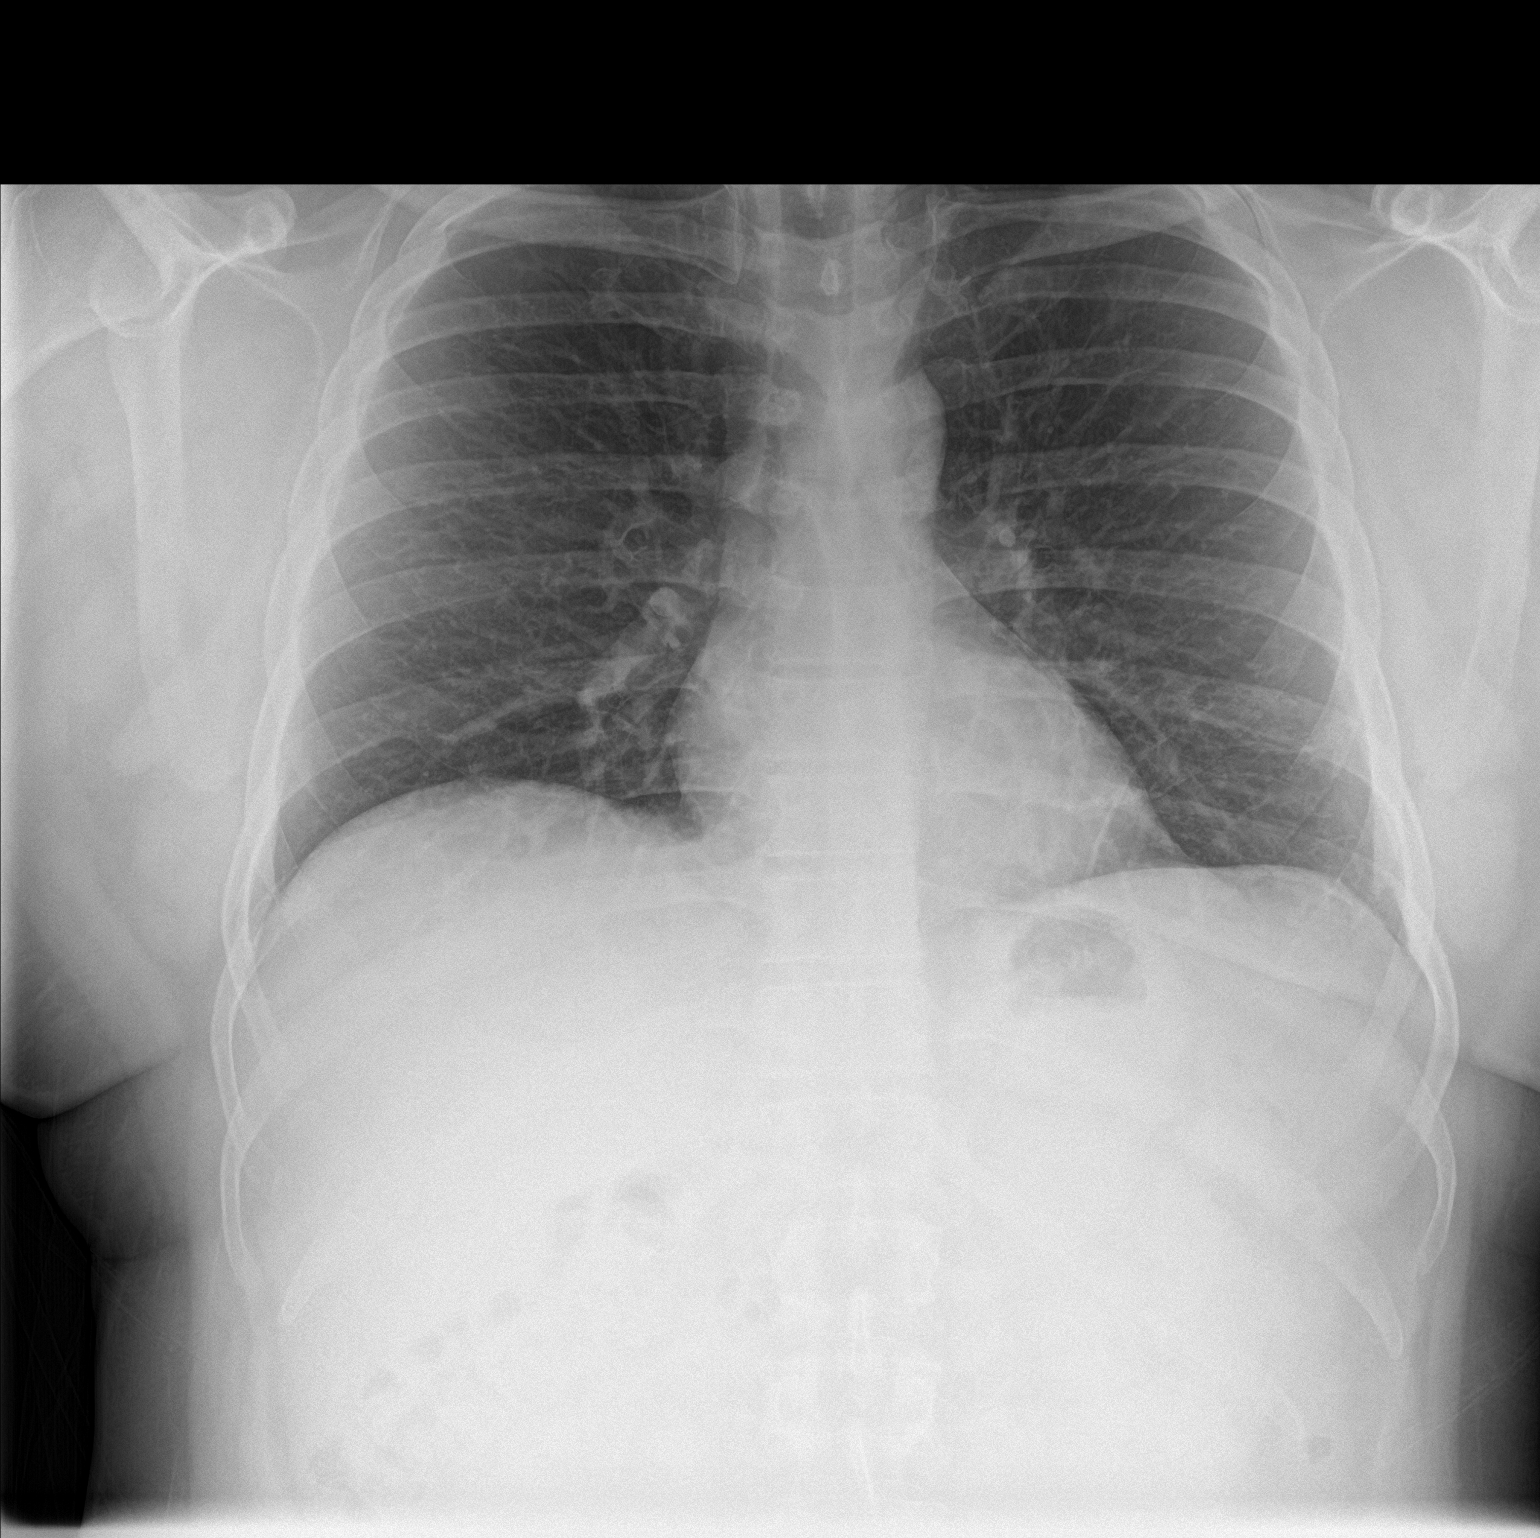

[chest lat]
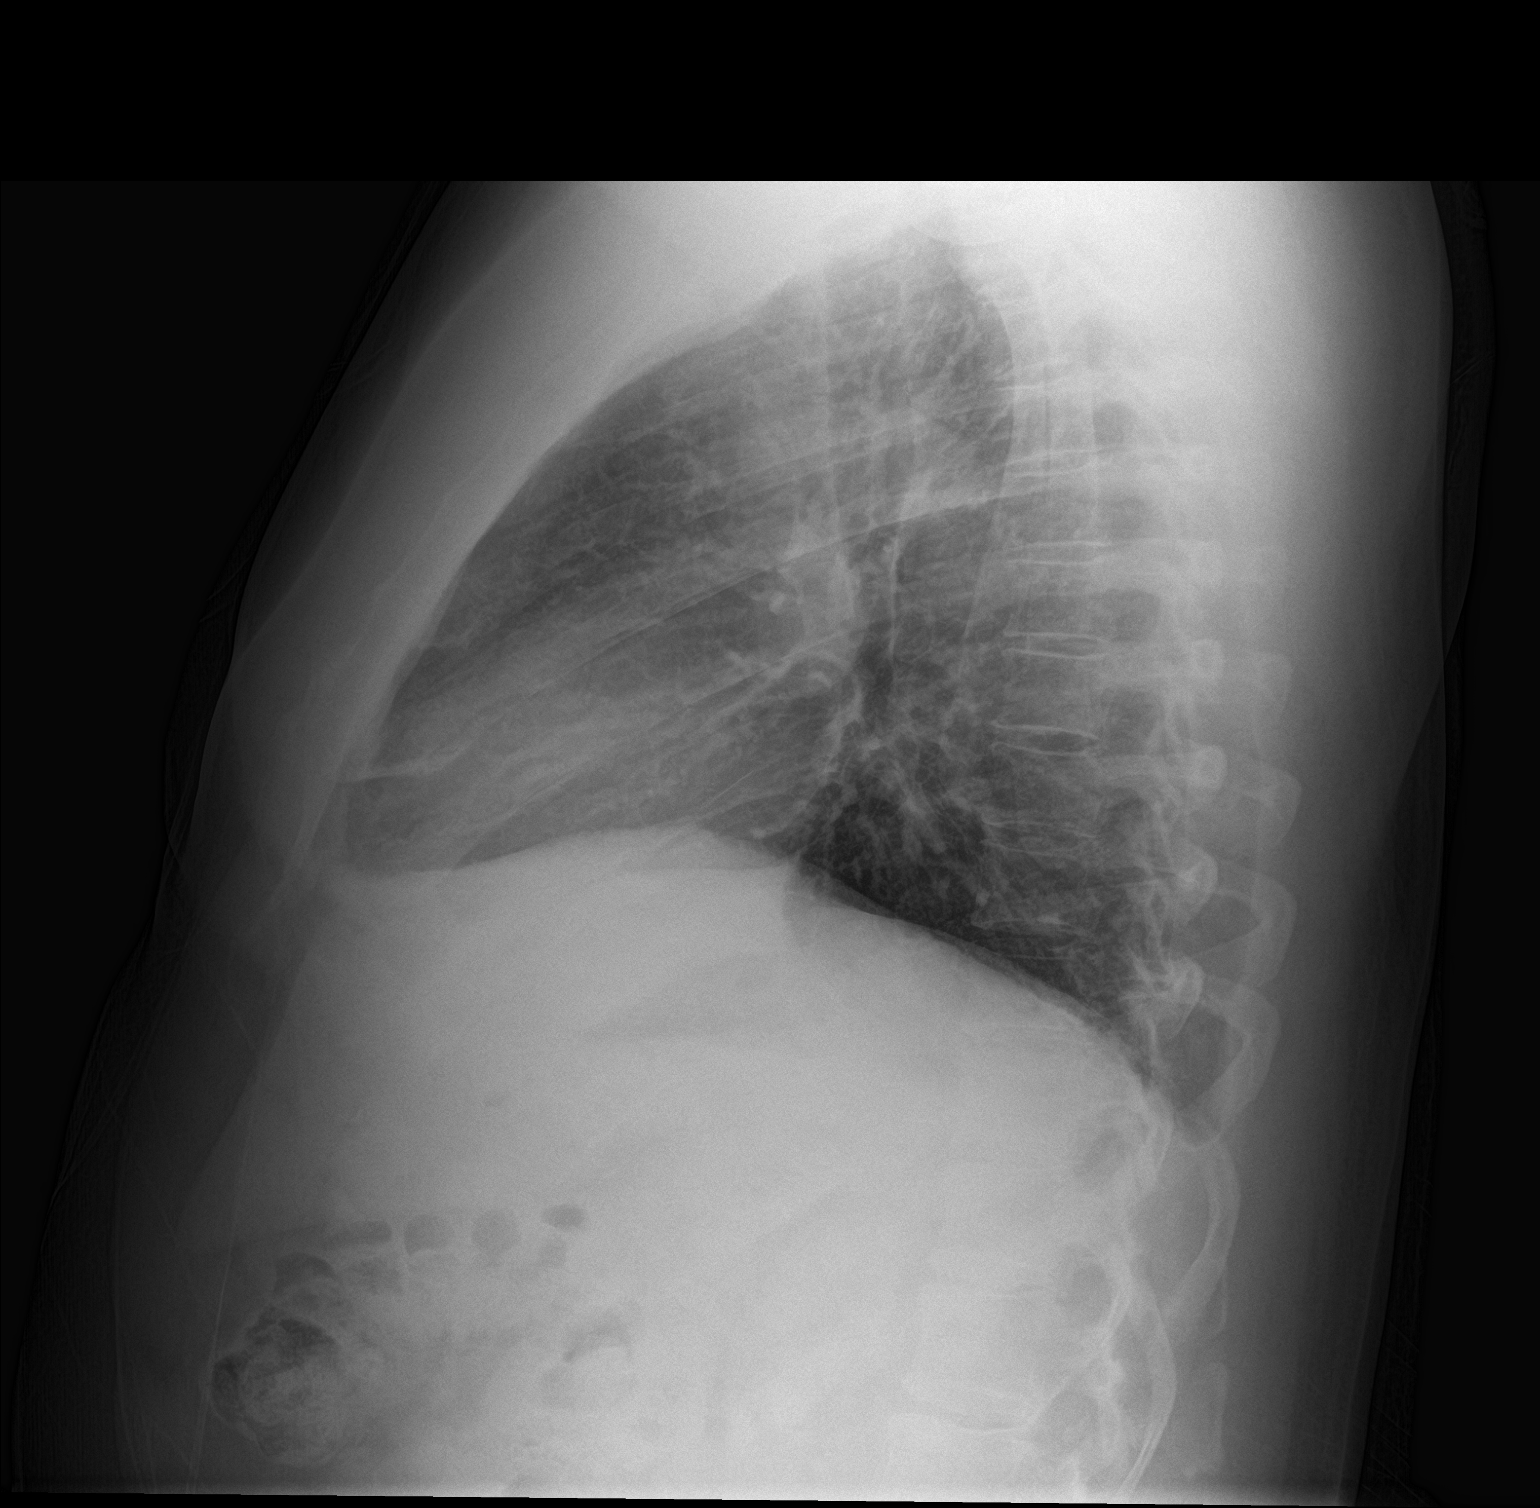

[2 of 2 positions shown; findings below may reference images not displayed]

FINDINGS: The lungs are clear. Heart size is normal. No pneumothorax or
pleural effusion. The bony abnormality.
IMPRESSION: Negative chest.

## 2017-02-12 ENCOUNTER — Encounter: Payer: BLUE CROSS/BLUE SHIELD | Admitting: Internal Medicine

## 2017-06-07 ENCOUNTER — Other Ambulatory Visit: Payer: Self-pay

## 2017-06-07 ENCOUNTER — Encounter (HOSPITAL_BASED_OUTPATIENT_CLINIC_OR_DEPARTMENT_OTHER): Payer: Self-pay | Admitting: Emergency Medicine

## 2017-06-07 ENCOUNTER — Emergency Department (HOSPITAL_BASED_OUTPATIENT_CLINIC_OR_DEPARTMENT_OTHER)
Admission: EM | Admit: 2017-06-07 | Discharge: 2017-06-07 | Disposition: A | Discharge status: Home / Self Care | Payer: Self-pay | Attending: Emergency Medicine | Admitting: Emergency Medicine

## 2017-06-07 DIAGNOSIS — G510 Bell's palsy: Secondary | ICD-10-CM | POA: Insufficient documentation

## 2017-06-07 DIAGNOSIS — Z5321 Procedure and treatment not carried out due to patient leaving prior to being seen by health care provider: Secondary | ICD-10-CM | POA: Insufficient documentation

## 2017-06-07 MED ORDER — ARTIFICIAL TEARS OPHTHALMIC OINT
TOPICAL_OINTMENT | Freq: Once | OPHTHALMIC | Status: AC
  Administered 2017-06-07: 15:00:00 via OPHTHALMIC
  Filled 2017-06-07: qty 3.5

## 2017-06-07 MED ORDER — PREDNISONE 20 MG PO TABS: 60.0000 mg | ORAL_TABLET | Freq: Every day | ORAL | 0 refills | Status: AC

## 2017-06-07 MED ORDER — VALACYCLOVIR HCL 500 MG PO TABS
1000.0000 mg | ORAL_TABLET | Freq: Once | ORAL | Status: AC
  Administered 2017-06-07: 1000 mg via ORAL
  Filled 2017-06-07: qty 2

## 2017-06-07 MED ORDER — VALACYCLOVIR HCL 1 G PO TABS: 1000.0000 mg | ORAL_TABLET | Freq: Three times a day (TID) | ORAL | 0 refills | Status: AC

## 2017-06-07 MED ORDER — PREDNISONE 50 MG PO TABS
60.0000 mg | ORAL_TABLET | Freq: Once | ORAL | Status: AC
  Administered 2017-06-07: 15:00:00 60 mg via ORAL
  Filled 2017-06-07: qty 1

## 2017-06-07 MED FILL — predniSONE 20 MG TABS: 20 | 7 days supply | Qty: 21 | Fill #0

## 2017-06-07 MED FILL — valACYclovir HCL 1 GM TABS: 1 | 7 days supply | Qty: 21 | Fill #0

## 2017-06-07 NOTE — ED Notes (Signed)
ED Provider at bedside. 

## 2017-06-07 NOTE — ED Triage Notes (Signed)
Patient states that he woke up this am with his left eye would not close and then it has been progressively worse and has effected his left face and mouth

## 2017-06-07 NOTE — Discharge Instructions (Signed)
We think you have Bell's palsy today.  Is important that he keep that left eye covered.  Please use the artificial tears every couple hours.  You can use the steroids and then also the valacyclovir for an antiviral.  Follow-up with your primary care physician.  This is most likely self resolving.  However should follow-up with your primary care and/or neurology if not improving.

## 2017-06-07 NOTE — ED Provider Notes (Signed)
[facility] Provider Note   CSN: [id number] Arrival date & time: 06/07/17  1358     History   Chief Complaint Chief Complaint  Patient presents with  . Facial Droop    HPI Dan Hernandez is a 47 y.o. male.  HPI   47 year old male presenting with facial droop on the left.  Patient reports that this morning fell as I was acting strangely so he went to [facility].  And he felt like his mouth followed suit.  Patient was worried that he might be dying.  Called his family and they told him come here to the emergency department.  Patient has history of PE, sickle cell trait.  Patient had no symptoms yesterday.  Patient has noted no vesicles.  Past Medical History:  Diagnosis Date  . CAP (community acquired pneumonia) 2010   Prob, Wert. Onset 09/03/08, small parapneumonica effusion 11/2008, ESR 58 September 22,2010  . Hyperglycemia    A1C 5.12 June 2010  . Pulmonary embolism ([place]) 06-2010   hypercoag w/u (-), saw hematology, Rx coumadin x 2 years  . Sickle cell trait [facility])     Patient Active Problem List   Diagnosis Date Noted  . PCP NOTES>>>>>>>>>>>>>>>>> 11/27/2015  . Anxiety state 09/08/2014  . Annual physical exam 12/09/2012  . Other malaise and fatigue 08/02/2012  . Hyperglycemia   . History of pulmonary embolism 06/05/2010    Past Surgical History:  Procedure Laterality Date  . FINGER SURGERY  04-2010   3 screws         Home Medications    Prior to Admission medications   Medication Sig Start Date End Date Taking? Authorizing Provider  aspirin 81 MG tablet Take 162 mg by mouth daily.     [provider]    Family History Family History  Problem Relation Age of Onset  . Deep vein thrombosis Other        B (PE), siter , daughter  . Coronary artery disease Other        not sure  . Diabetes Unknown        uncle ?  Marland Kitchen Colon cancer Neg Hx   . Prostate cancer Neg Hx     Social History Social History   Tobacco Use  .  Smoking status: Never Smoker  . Smokeless tobacco: Never Used  Substance Use Topics  . Alcohol use: Yes    Comment: occ drink  . Drug use: No     Allergies   Patient has no known allergies.   Review of Systems Review of Systems  Constitutional: Negative for activity change, fatigue and fever.  HENT: Positive for drooling. Negative for ear discharge, trouble swallowing and voice change.   Eyes: Positive for pain.  Respiratory: Negative for shortness of breath.   Cardiovascular: Negative for chest pain.  Gastrointestinal: Negative for abdominal pain.  All other systems reviewed and are negative.    Physical Exam Updated Vital Signs BP (!) 144/102 (BP Location: Left Arm)   Pulse 98   Temp 98.7 F (37.1 C) (Oral)   Resp 18   Ht '5\' 8"'  (1.727 m)   Wt 95.3 kg (210 lb)   SpO2 100%   BMI 31.93 kg/m   Physical Exam  Constitutional: He is oriented to person, place, and time. He appears well-nourished.  HENT:  Head: Normocephalic and atraumatic.  Patient has exam consistent with Bell's palsy.  The left side of his face is affected.  Including upper.  Patient unable to close left eye completely.  Eyes: Conjunctivae are normal. Right eye exhibits no discharge. Left eye exhibits no discharge.  No erythema or draining to the eyes.  TMs with no evidence of vesicles.  Cardiovascular: Normal rate.  Pulmonary/Chest: Effort normal.  Neurological: He is oriented to person, place, and time.  Skin: Skin is warm and dry. He is not diaphoretic.  Psychiatric: He has a normal mood and affect. His behavior is normal.     ED Treatments / Results  Labs (all labs ordered are listed, but only abnormal results are displayed) Labs Reviewed - No data to display  EKG None  Radiology No results found.  Procedures Procedures (including critical care time)  Medications Ordered in ED Medications - No data to display   Initial Impression / Assessment and Plan / ED Course  I have  reviewed the triage vital signs and the nursing notes.  Pertinent labs & imaging results that were available during my care of the patient were reviewed by me and considered in my medical decision making (see chart for details).     47 year old male presenting with facial droop on the left.  Patient reports that this morning fell as I was acting strangely [facility]  And he felt like his mouth followed suit.  Patient was worried that he might be dying.  Called his family and they told him come here to the emergency department.  Patient has history of PE, sickle cell trait.  Patient had no symptoms yesterday.  Patient has noted no vesicles.  2:35 PM Patient has Bell's palsy.  Will treat with eye patch, artificial tears.  Will start prednisone and give  antiviral.  Final Clinical Impressions(s) / ED Diagnoses   Final diagnoses:  None    ED Discharge Orders    None       Macarthur Critchley, MD 06/13/17 2318

## 2018-06-13 ENCOUNTER — Telehealth: Payer: Self-pay

## 2018-06-13 NOTE — Telephone Encounter (Signed)
LMOM- last visit 2017. Virtual visit? Moved? Receiving primary care elsewhere?

## 2018-10-09 ENCOUNTER — Encounter: Payer: Self-pay | Admitting: Internal Medicine
